# Patient Record
Sex: Female | Born: 1960
Health system: Southern US, Community
[De-identification: ages and names within clinical notes are randomized; demographics above are authoritative.]

## PROBLEM LIST (undated history)

## (undated) DIAGNOSIS — R000 Tachycardia, unspecified: Secondary | ICD-10-CM

## (undated) DIAGNOSIS — K602 Anal fissure, unspecified: Secondary | ICD-10-CM

## (undated) DIAGNOSIS — R87619 Unspecified abnormal cytological findings in specimens from cervix uteri: Secondary | ICD-10-CM

## (undated) HISTORY — DX: Tachycardia, unspecified: R00.0

## (undated) HISTORY — DX: Anal fissure, unspecified: K60.2

## (undated) HISTORY — DX: Unspecified abnormal cytological findings in specimens from cervix uteri: R87.619

---

## 1995-06-07 HISTORY — PX: CAUTERIZE INNER NOSE: SHX279

## 1998-10-19 ENCOUNTER — Other Ambulatory Visit: Admission: RE | Admit: 1998-10-19 | Discharge: 1998-10-19 | Payer: Self-pay | Admitting: Gynecology

## 2002-11-20 ENCOUNTER — Other Ambulatory Visit: Admission: RE | Admit: 2002-11-20 | Discharge: 2002-11-20 | Payer: Self-pay | Admitting: Gynecology

## 2003-05-21 ENCOUNTER — Other Ambulatory Visit: Admission: RE | Admit: 2003-05-21 | Discharge: 2003-05-21 | Payer: Self-pay | Admitting: Gynecology

## 2003-11-21 ENCOUNTER — Other Ambulatory Visit: Admission: RE | Admit: 2003-11-21 | Discharge: 2003-11-21 | Payer: Self-pay | Admitting: Gynecology

## 2004-11-26 ENCOUNTER — Other Ambulatory Visit: Admission: RE | Admit: 2004-11-26 | Discharge: 2004-11-26 | Payer: Self-pay | Admitting: Gynecology

## 2005-11-30 ENCOUNTER — Other Ambulatory Visit: Admission: RE | Admit: 2005-11-30 | Discharge: 2005-11-30 | Payer: Self-pay | Admitting: Gynecology

## 2007-01-24 ENCOUNTER — Other Ambulatory Visit: Admission: RE | Admit: 2007-01-24 | Discharge: 2007-01-24 | Payer: Self-pay | Admitting: Gynecology

## 2007-06-07 DIAGNOSIS — R000 Tachycardia, unspecified: Secondary | ICD-10-CM

## 2007-06-07 HISTORY — DX: Tachycardia, unspecified: R00.0

## 2007-06-14 ENCOUNTER — Emergency Department (HOSPITAL_COMMUNITY): Admission: EM | Admit: 2007-06-14 | Discharge: 2007-06-14 | Payer: Self-pay | Admitting: Emergency Medicine

## 2008-01-25 ENCOUNTER — Other Ambulatory Visit: Admission: RE | Admit: 2008-01-25 | Discharge: 2008-01-25 | Payer: Self-pay | Admitting: Gynecology

## 2009-01-07 IMAGING — CR DG CHEST 2V
2 series · 2 of 2 positions shown · non-contrast
Comparison: None.

CLINICAL DATA: Palpitations

[view not recorded (1 of 2)]
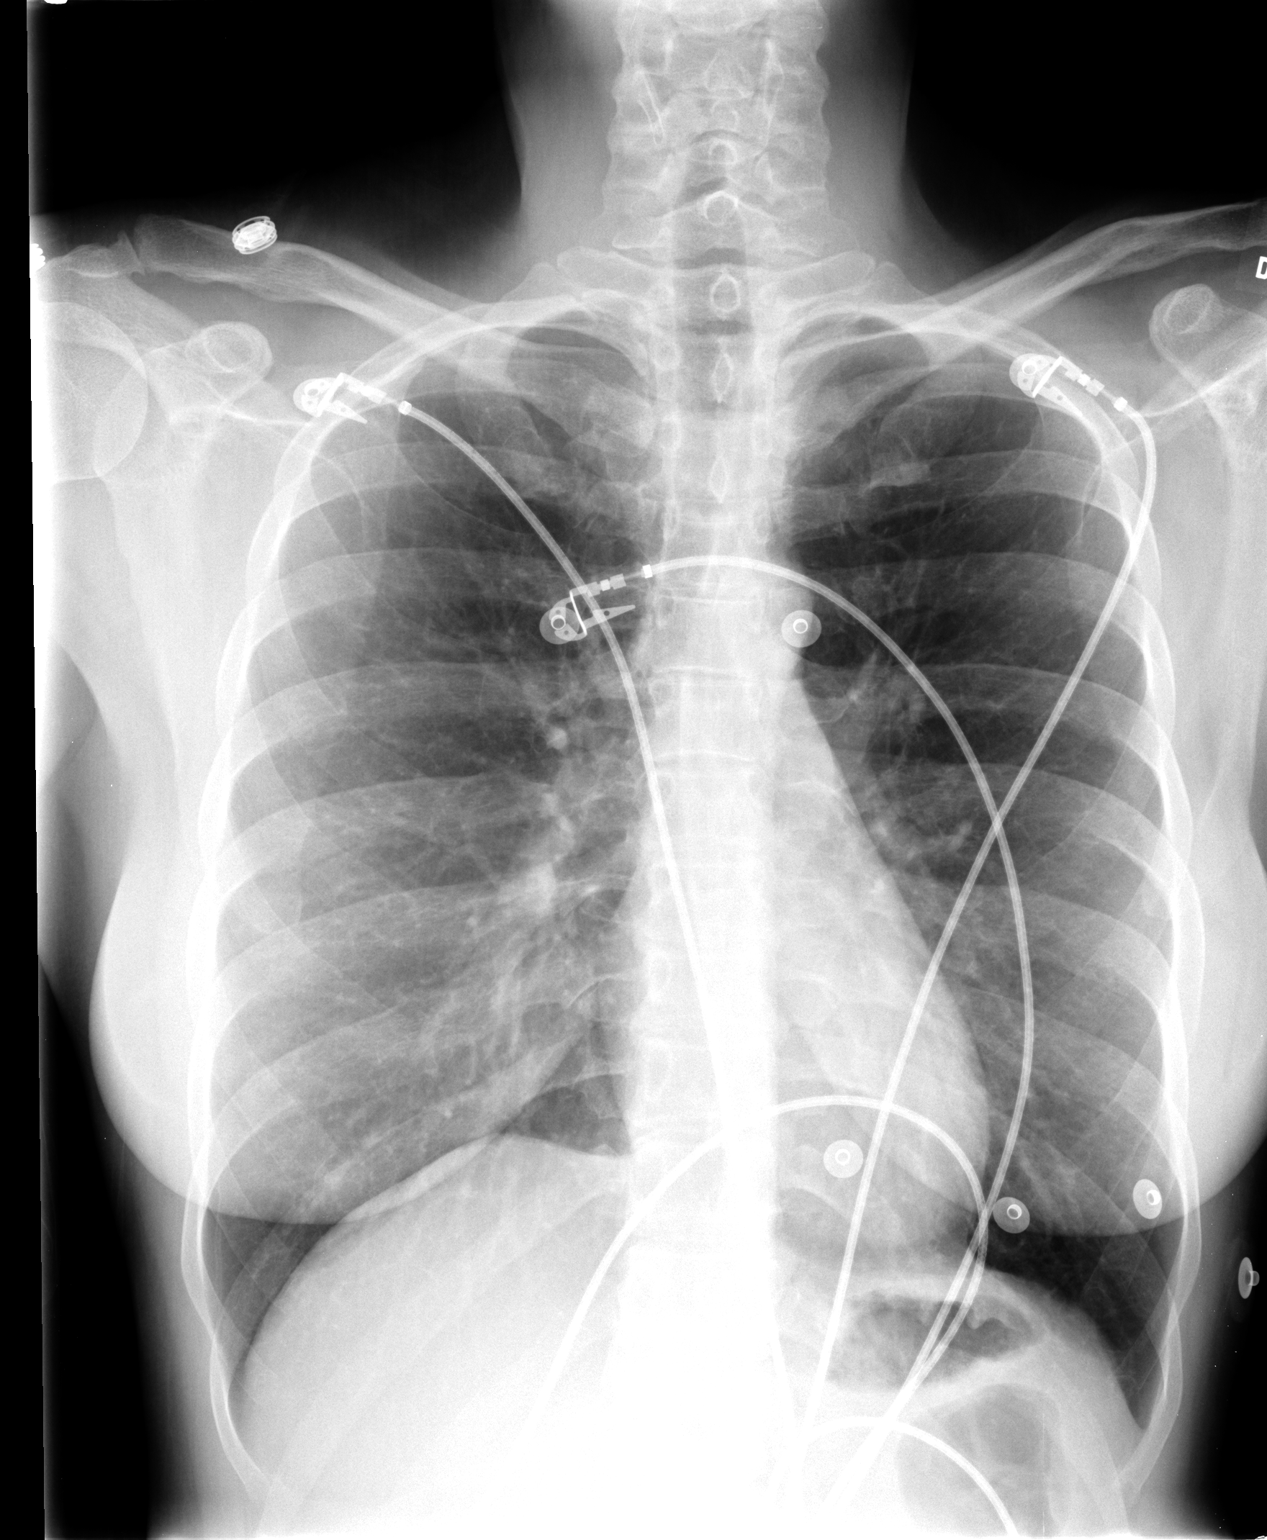

[view not recorded (2 of 2)]
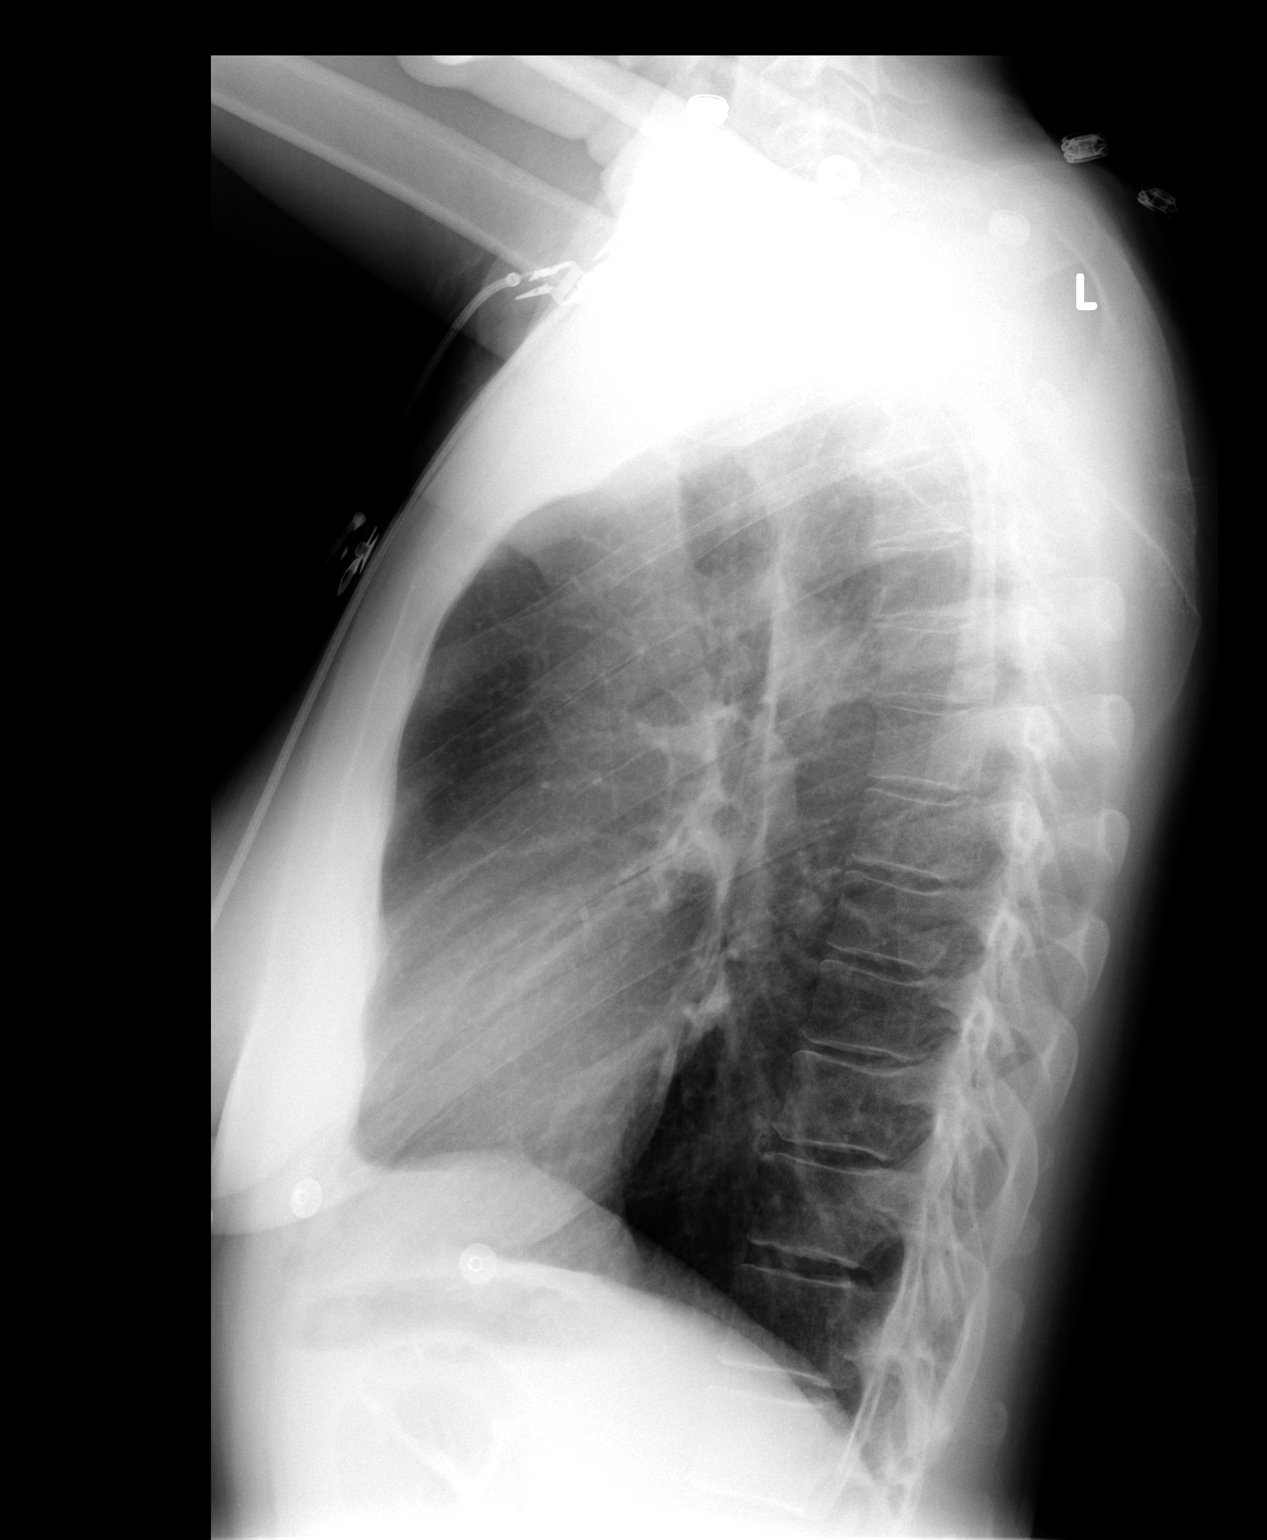

[2 of 2 positions shown; findings below may reference images not displayed]

CHEST - 2 VIEW:

The lungs are clear.  The cardiopericardial silhouette is within normal limits
for size.  Visualized bony structures of the thorax are intact.
IMPRESSION: No acute cardiopulmonary process

## 2009-01-21 ENCOUNTER — Other Ambulatory Visit: Admission: RE | Admit: 2009-01-21 | Discharge: 2009-01-21 | Payer: Self-pay | Admitting: Gynecology

## 2009-01-21 ENCOUNTER — Ambulatory Visit: Payer: Self-pay | Admitting: Women's Health

## 2009-01-21 ENCOUNTER — Encounter: Payer: Self-pay | Admitting: Women's Health

## 2009-06-06 DIAGNOSIS — R87619 Unspecified abnormal cytological findings in specimens from cervix uteri: Secondary | ICD-10-CM

## 2009-06-06 HISTORY — DX: Unspecified abnormal cytological findings in specimens from cervix uteri: R87.619

## 2009-06-06 HISTORY — PX: HYSTEROSCOPY: SHX211

## 2009-06-06 HISTORY — PX: DILATION AND CURETTAGE OF UTERUS: SHX78

## 2009-12-21 ENCOUNTER — Other Ambulatory Visit: Admission: RE | Admit: 2009-12-21 | Discharge: 2009-12-21 | Payer: Self-pay | Admitting: Gynecology

## 2009-12-21 ENCOUNTER — Ambulatory Visit: Payer: Self-pay | Admitting: Women's Health

## 2010-01-05 ENCOUNTER — Ambulatory Visit: Payer: Self-pay | Admitting: Gynecology

## 2010-01-08 ENCOUNTER — Ambulatory Visit: Payer: Self-pay | Admitting: Gynecology

## 2010-01-27 ENCOUNTER — Ambulatory Visit (HOSPITAL_BASED_OUTPATIENT_CLINIC_OR_DEPARTMENT_OTHER): Admission: RE | Admit: 2010-01-27 | Discharge: 2010-01-27 | Payer: Self-pay | Admitting: Gynecology

## 2010-01-27 ENCOUNTER — Ambulatory Visit: Payer: Self-pay | Admitting: Gynecology

## 2010-02-10 ENCOUNTER — Ambulatory Visit: Payer: Self-pay | Admitting: Gynecology

## 2010-08-20 LAB — POCT HEMOGLOBIN-HEMACUE: Hemoglobin: 14.9 g/dL (ref 12.0–15.0)

## 2010-10-27 ENCOUNTER — Other Ambulatory Visit: Payer: Self-pay | Admitting: Women's Health

## 2010-10-27 ENCOUNTER — Encounter (INDEPENDENT_AMBULATORY_CARE_PROVIDER_SITE_OTHER): Payer: BC Managed Care – PPO | Admitting: Women's Health

## 2010-10-27 ENCOUNTER — Other Ambulatory Visit (HOSPITAL_COMMUNITY)
Admission: RE | Admit: 2010-10-27 | Discharge: 2010-10-27 | Disposition: A | Discharge status: Home / Self Care | Payer: BC Managed Care – PPO | Source: Ambulatory Visit | Attending: Gynecology | Admitting: Gynecology

## 2010-10-27 DIAGNOSIS — Z01419 Encounter for gynecological examination (general) (routine) without abnormal findings: Secondary | ICD-10-CM

## 2010-10-27 DIAGNOSIS — Z124 Encounter for screening for malignant neoplasm of cervix: Secondary | ICD-10-CM | POA: Insufficient documentation

## 2010-10-27 DIAGNOSIS — Z1231 Encounter for screening mammogram for malignant neoplasm of breast: Secondary | ICD-10-CM

## 2010-11-25 ENCOUNTER — Ambulatory Visit (HOSPITAL_COMMUNITY)
Admission: RE | Admit: 2010-11-25 | Discharge: 2010-11-25 | Disposition: A | Discharge status: Home / Self Care | Payer: BC Managed Care – PPO | Source: Ambulatory Visit | Attending: Women's Health | Admitting: Women's Health

## 2010-11-25 DIAGNOSIS — Z1231 Encounter for screening mammogram for malignant neoplasm of breast: Secondary | ICD-10-CM | POA: Insufficient documentation

## 2011-02-17 ENCOUNTER — Other Ambulatory Visit: Payer: Self-pay | Admitting: Women's Health

## 2011-02-24 LAB — POCT CARDIAC MARKERS
CKMB, poc: <1 — ABNORMAL LOW
Myoglobin, poc: 77.4
Operator id: 198171
Troponin i, poc: <0.05

## 2011-02-24 LAB — I-STAT 8, (EC8 V) (CONVERTED LAB)
Acid-Base Excess: 2
Chloride: 107
Glucose, Bld: 80
Potassium: 4.2
pCO2, Ven: 38.4 — ABNORMAL LOW
pH, Ven: 7.441 — ABNORMAL HIGH

## 2011-02-24 LAB — POCT I-STAT CREATININE
Creatinine, Ser: 0.9
Operator id: 198171

## 2011-03-01 DIAGNOSIS — F419 Anxiety disorder, unspecified: Secondary | ICD-10-CM | POA: Insufficient documentation

## 2011-05-02 MED ORDER — LEVONORG-ETH ESTRAD TRIPHASIC PO TABS: 1.0000 | ORAL_TABLET | Freq: Every day | ORAL | Status: DC

## 2011-05-02 NOTE — Telephone Encounter (Signed)
Addended by: Venora Maples on: 05/02/2011 01:00 PM   Modules accepted: Orders

## 2011-05-02 NOTE — Telephone Encounter (Signed)
PT. CALLED STATING WE NEED TO SEND IN CORRECT 90 DAY SUPPLY WITH REFILLS. NOTIFIED PT RX FIXED AND RESENT. PT. NOTIFIED RX FIXED.

## 2011-05-24 ENCOUNTER — Other Ambulatory Visit: Payer: Self-pay | Admitting: *Deleted

## 2011-05-24 MED ORDER — ALPRAZOLAM 0.25 MG PO TABS: 0.2500 mg | ORAL_TABLET | Freq: Every evening | ORAL | Status: DC | PRN

## 2011-05-24 NOTE — Telephone Encounter (Signed)
RX CALLED IN

## 2011-06-17 ENCOUNTER — Other Ambulatory Visit: Payer: Self-pay | Admitting: *Deleted

## 2011-06-17 ENCOUNTER — Telehealth: Payer: Self-pay | Admitting: *Deleted

## 2011-06-17 MED ORDER — LEVONORG-ETH ESTRAD TRIPHASIC PO TABS: 1.0000 | ORAL_TABLET | Freq: Every day | ORAL | Status: DC

## 2011-06-17 NOTE — Telephone Encounter (Signed)
Pt called stating that pharmacy gave her wrong rx for birth control, will resent medication for pt.

## 2011-06-20 ENCOUNTER — Telehealth: Payer: Self-pay | Admitting: *Deleted

## 2011-06-20 NOTE — Telephone Encounter (Signed)
Error

## 2011-06-21 ENCOUNTER — Telehealth: Payer: Self-pay | Admitting: *Deleted

## 2011-06-21 NOTE — Telephone Encounter (Signed)
Pt calling wanting to start back on her Trivora  28 birth control tablets. She does not want the generic brand. She was taking generic enpresse 28 and then they gave her generic myzilra tablets.  Pt said [facility] told her since myzilra pills have been filled already that the only way she could get the brand Lennie Hummer is if rx says"fill now pt is not taking myzilra birth contro due to health management". Please advise

## 2011-06-27 ENCOUNTER — Encounter: Payer: Self-pay | Admitting: *Deleted

## 2011-06-27 MED ORDER — LEVONORG-ETH ESTRAD TRIPHASIC PO TABS: 1.0000 | ORAL_TABLET | Freq: Every day | ORAL | Status: DC

## 2011-06-27 NOTE — Progress Notes (Signed)
Patient ID: Traci Jacobs, female   DOB: July 30, 1960, 51 y.o.   MRN: [id number] Pt called wanting to have 3 pack [facility] [place] for brand only trivora 28 day pill. [facility] will not mail rx out until 8 days later.

## 2011-06-27 NOTE — Telephone Encounter (Signed)
Please call in trivora for pt with stipulations of fill now pt is not taking myzilra due to health management.  Since [facility] please call in 3 pks with refills.

## 2011-06-27 NOTE — Telephone Encounter (Signed)
Pt informed with the below note, rx sent to [facility]

## 2011-08-30 ENCOUNTER — Other Ambulatory Visit: Payer: Self-pay | Admitting: Women's Health

## 2011-08-30 DIAGNOSIS — Z1231 Encounter for screening mammogram for malignant neoplasm of breast: Secondary | ICD-10-CM

## 2011-10-25 ENCOUNTER — Ambulatory Visit (HOSPITAL_COMMUNITY)
Admission: RE | Admit: 2011-10-25 | Discharge: 2011-10-25 | Disposition: A | Discharge status: Home / Self Care | Payer: 59 | Source: Ambulatory Visit | Attending: Women's Health | Admitting: Women's Health

## 2011-10-25 DIAGNOSIS — Z1231 Encounter for screening mammogram for malignant neoplasm of breast: Secondary | ICD-10-CM | POA: Insufficient documentation

## 2011-10-27 ENCOUNTER — Ambulatory Visit (INDEPENDENT_AMBULATORY_CARE_PROVIDER_SITE_OTHER): Payer: 59 | Admitting: Women's Health

## 2011-10-27 ENCOUNTER — Encounter: Payer: Self-pay | Admitting: Women's Health

## 2011-10-27 ENCOUNTER — Other Ambulatory Visit (HOSPITAL_COMMUNITY)
Admission: RE | Admit: 2011-10-27 | Discharge: 2011-10-27 | Disposition: A | Discharge status: Home / Self Care | Payer: 59 | Source: Ambulatory Visit | Attending: Obstetrics and Gynecology | Admitting: Obstetrics and Gynecology

## 2011-10-27 VITALS — BP 120/78 | Ht 67.0 in | Wt 156.0 lb

## 2011-10-27 DIAGNOSIS — Z01419 Encounter for gynecological examination (general) (routine) without abnormal findings: Secondary | ICD-10-CM

## 2011-10-27 MED ORDER — LEVONORG-ETH ESTRAD TRIPHASIC PO TABS: 1.0000 | ORAL_TABLET | Freq: Every day | ORAL | Status: DC

## 2011-10-27 MED ORDER — TRIVORA (28) 50-30/75-40/ 125-30 MCG PO TABS: 1.0000 | ORAL_TABLET | Freq: Every day | ORAL | Status: DC

## 2011-10-27 NOTE — Patient Instructions (Signed)

## 2011-10-27 NOTE — Progress Notes (Signed)
Traci Jacobs 03-Apr-1961 [id number]    History:    The patient presents for annual exam.  Monthly 4 days cycle on Trivora without complaint. History of atypical glandular cells on Pap in 2011 with hysteroscopy, D&C with negative/benign  ECC and [facility] 8/11.  Normal Pap 2012, and Paps prior. History of a normal colonoscopy July 2010, history of anal fissure. History of normal mammograms.  Past medical history, past surgical history, family history and social history were all reviewed and documented in the EPIC chart. Works for [employer] from home. Healthy diet, very active lifestyle, yoga and weights.   ROS:  A  ROS was performed and pertinent positives and negatives are included in the history.  Exam:  Filed Vitals:   10/27/11 1201  BP: 120/78    General appearance:  Normal Head/Neck:  Normal, without cervical or supraclavicular adenopathy. Thyroid:  Symmetrical, normal in size, without palpable masses or nodularity. Respiratory  Effort:  Normal  Auscultation:  Clear without wheezing or rhonchi Cardiovascular  Auscultation:  Regular rate, without rubs, murmurs or gallops  Edema/varicosities:  Not grossly evident Abdominal  Soft,nontender, without masses, guarding or rebound.  Liver/spleen:  No organomegaly noted  Hernia:  None appreciated  Skin  Inspection:  Grossly normal  Palpation:  Grossly normal Neurologic/psychiatric  Orientation:  Normal with appropriate conversation.  Mood/affect:  Normal  Genitourinary    Breasts: Examined lying and sitting.     Right: Without masses, retractions, discharge or axillary adenopathy.     Left: Without masses, retractions, discharge or axillary adenopathy.   Inguinal/mons:  Normal without inguinal adenopathy  External genitalia:  Normal  BUS/Urethra/Skene's glands:  Normal  Bladder:  Normal  Vagina:  Normal  Cervix:  Normal  Uterus:   normal in size, shape and contour.  Midline and mobile  Adnexa/parametria:      Rt: Without masses or tenderness.   Lt: Without masses or tenderness.  Anus and perineum: Normal  Digital rectal exam: Refused, history of fissures.  Assessment/Plan:  51 y.o. M WF G0  for annual exam with no complaints.  Normal GYN exam on Trivora History of atypical glandular cells on Pap with a benign hysteroscopic D&C with benign pathology August 2011/normal Pap 2012  Plan: Trivora prescription, branded name, proper use, slight risk for blood clots and strokes. Menopausal symptoms reviewed, denies, states sisters were mid 12s. SBE's, continue annual mammogram, calcium rich diet, MVI daily, vitamin D 1000 daily and continue healthy lifestyle encouraged. No labs, had normal labs at primary care. Pap only.    Harrington Challenger [facility], 12:50 PM 10/27/2011

## 2012-08-16 ENCOUNTER — Other Ambulatory Visit: Payer: Self-pay | Admitting: Women's Health

## 2012-08-16 DIAGNOSIS — Z1231 Encounter for screening mammogram for malignant neoplasm of breast: Secondary | ICD-10-CM

## 2012-10-16 ENCOUNTER — Other Ambulatory Visit (HOSPITAL_BASED_OUTPATIENT_CLINIC_OR_DEPARTMENT_OTHER): Payer: Self-pay | Admitting: Gynecology

## 2012-10-17 NOTE — Telephone Encounter (Signed)
Rx called in KW 

## 2012-10-22 ENCOUNTER — Ambulatory Visit (HOSPITAL_COMMUNITY)
Admission: RE | Admit: 2012-10-22 | Discharge: 2012-10-22 | Disposition: A | Discharge status: Home / Self Care | Payer: 59 | Source: Ambulatory Visit | Attending: Women's Health | Admitting: Women's Health

## 2012-10-22 DIAGNOSIS — Z1231 Encounter for screening mammogram for malignant neoplasm of breast: Secondary | ICD-10-CM | POA: Insufficient documentation

## 2012-10-24 ENCOUNTER — Other Ambulatory Visit: Payer: Self-pay | Admitting: Women's Health

## 2012-10-24 ENCOUNTER — Other Ambulatory Visit: Payer: Self-pay | Admitting: *Deleted

## 2012-10-24 DIAGNOSIS — N6489 Other specified disorders of breast: Secondary | ICD-10-CM

## 2012-10-24 DIAGNOSIS — R928 Other abnormal and inconclusive findings on diagnostic imaging of breast: Secondary | ICD-10-CM

## 2012-11-06 ENCOUNTER — Other Ambulatory Visit: Payer: 59

## 2012-11-06 ENCOUNTER — Ambulatory Visit
Admission: RE | Admit: 2012-11-06 | Discharge: 2012-11-06 | Disposition: A | Discharge status: Home / Self Care | Payer: 59 | Source: Ambulatory Visit | Attending: Women's Health | Admitting: Women's Health

## 2012-11-06 DIAGNOSIS — N6489 Other specified disorders of breast: Secondary | ICD-10-CM

## 2012-11-21 ENCOUNTER — Encounter: Payer: Self-pay | Admitting: Women's Health

## 2012-11-23 ENCOUNTER — Ambulatory Visit (INDEPENDENT_AMBULATORY_CARE_PROVIDER_SITE_OTHER): Payer: 59 | Admitting: Women's Health

## 2012-11-23 ENCOUNTER — Encounter: Payer: Self-pay | Admitting: Women's Health

## 2012-11-23 ENCOUNTER — Other Ambulatory Visit (HOSPITAL_COMMUNITY)
Admission: RE | Admit: 2012-11-23 | Discharge: 2012-11-23 | Disposition: A | Discharge status: Home / Self Care | Payer: 59 | Source: Ambulatory Visit | Attending: Gynecology | Admitting: Gynecology

## 2012-11-23 VITALS — BP 128/76 | Ht 67.0 in | Wt 156.0 lb

## 2012-11-23 DIAGNOSIS — IMO0001 Reserved for inherently not codable concepts without codable children: Secondary | ICD-10-CM

## 2012-11-23 DIAGNOSIS — Z309 Encounter for contraceptive management, unspecified: Secondary | ICD-10-CM

## 2012-11-23 DIAGNOSIS — Z01419 Encounter for gynecological examination (general) (routine) without abnormal findings: Secondary | ICD-10-CM | POA: Insufficient documentation

## 2012-11-23 MED ORDER — TRIVORA (28) 50-30/75-40/ 125-30 MCG PO TABS: 1.0000 | ORAL_TABLET | Freq: Every day | ORAL | Status: DC

## 2012-11-23 NOTE — Progress Notes (Signed)
Traci Jacobs April 26, 1961 [id number]    History:    The patient presents for annual exam.  Light monthly cycle on Trivora. Occasional hot flushes. Reports sisters mid 46s when menopausal. History of atypical  endometrial cells on Pap in 2011 with a negative biopsy and negative hysteroscopy. History of tachycardia with negative workup. Negative colonoscopy 2010. Negative diagnostic mammogram 10/2012.   Past medical history, past surgical history, family history and social history were all reviewed and documented in the EPIC chart. Works from home for [employer]Marland Kitchen Healthy lifestyle of exercise and diet. Going to [place] for 2 weeks in the fall.   ROS:  A  ROS was performed and pertinent positives and negatives are included in the history.  Exam:  Filed Vitals:   11/23/12 1005  BP: 128/76    General appearance:  Normal Head/Neck:  Normal, without cervical or supraclavicular adenopathy. Thyroid:  Symmetrical, normal in size, without palpable masses or nodularity. Respiratory  Effort:  Normal  Auscultation:  Clear without wheezing or rhonchi Cardiovascular  Auscultation:  Regular rate, without rubs, murmurs or gallops  Edema/varicosities:  Not grossly evident Abdominal  Soft,nontender, without masses, guarding or rebound.  Liver/spleen:  No organomegaly noted  Hernia:  None appreciated  Skin  Inspection:  Grossly normal  Palpation:  Grossly normal Neurologic/psychiatric  Orientation:  Normal with appropriate conversation.  Mood/affect:  Normal  Genitourinary    Breasts: refused exam, states just had a diagnostic mammogram.     Left: Without masses, retractions, discharge or axillary adenopathy.   Inguinal/mons:  Normal without inguinal adenopathy  External genitalia:  Normal  BUS/Urethra/Skene's glands:  Normal  Bladder:  Normal  Vagina:  Normal  Cervix:  Normal  Uterus:   normal in size, shape and contour.  Midline and mobile  Adnexa/parametria:     Rt: Without  masses or tenderness.   Lt: Without masses or tenderness.  Anus and perineum: Normal  Digital rectal exam: Normal sphincter tone without palpated masses or tenderness  Assessment/Plan:  [name]  for annual exam.    Normal perimenopausal exam on Trivora  Plan: Instructed to return to office for [facility] during placebo week, Trivora prescription, proper use, slight risk for blood clots and strokes reviewed. SBE's, continue annual mammogram, 3-D tomography reviewed and encouraged. Had labs at primary care that she reports as normal. Pap normal but lacked endocervical cells 2013, Pap taken. Home Hemoccult card given with instructions.   Harrington Challenger [facility], 1:23 PM 11/23/2012

## 2012-11-23 NOTE — Patient Instructions (Addendum)

## 2012-11-26 ENCOUNTER — Encounter: Payer: Self-pay | Admitting: Gynecology

## 2012-12-25 ENCOUNTER — Other Ambulatory Visit: Payer: Self-pay | Admitting: Women's Health

## 2013-09-17 ENCOUNTER — Other Ambulatory Visit: Payer: Self-pay

## 2013-09-17 DIAGNOSIS — Z1231 Encounter for screening mammogram for malignant neoplasm of breast: Secondary | ICD-10-CM

## 2013-10-13 ENCOUNTER — Other Ambulatory Visit: Payer: Self-pay | Admitting: Women's Health

## 2013-10-22 ENCOUNTER — Ambulatory Visit
Admission: RE | Admit: 2013-10-22 | Discharge: 2013-10-22 | Disposition: A | Discharge status: Home / Self Care | Payer: 59 | Source: Ambulatory Visit

## 2013-10-22 ENCOUNTER — Encounter (INDEPENDENT_AMBULATORY_CARE_PROVIDER_SITE_OTHER): Payer: Self-pay

## 2013-10-22 DIAGNOSIS — Z1231 Encounter for screening mammogram for malignant neoplasm of breast: Secondary | ICD-10-CM

## 2013-11-22 ENCOUNTER — Encounter: Payer: 59 | Admitting: Women's Health

## 2013-12-24 ENCOUNTER — Encounter: Payer: 59 | Admitting: Gynecology

## 2014-01-05 ENCOUNTER — Other Ambulatory Visit: Payer: Self-pay | Admitting: Women's Health

## 2014-01-10 ENCOUNTER — Telehealth: Payer: Self-pay

## 2014-01-10 ENCOUNTER — Other Ambulatory Visit: Payer: Self-pay | Admitting: Women's Health

## 2014-01-10 MED ORDER — LEVONORG-ETH ESTRAD TRIPHASIC PO TABS: 1.0000 | ORAL_TABLET | Freq: Every day | ORAL | Status: DC

## 2014-01-10 NOTE — Telephone Encounter (Signed)
Refill for 1 month.

## 2014-01-10 NOTE — Telephone Encounter (Signed)
Rx sent for ONE pack to pharmacy.

## 2014-01-10 NOTE — Telephone Encounter (Signed)
On 8/2 pharmacy sent refill request for patient's Trivora 3 packs.  It was denied because patient overdue for CE and has NG appt with Dr. Douglass Riversracy Lathrop on 01/20/14.  The pharmacy was calling today to question if the could refill it.

## 2014-01-20 ENCOUNTER — Encounter: Payer: 59 | Admitting: Gynecology

## 2014-01-20 ENCOUNTER — Encounter: Payer: Self-pay | Admitting: Gynecology

## 2014-01-20 ENCOUNTER — Ambulatory Visit (INDEPENDENT_AMBULATORY_CARE_PROVIDER_SITE_OTHER): Payer: 59 | Admitting: Gynecology

## 2014-01-20 VITALS — BP 114/84 | HR 76 | Resp 16 | Ht 66.0 in | Wt 164.0 lb

## 2014-01-20 DIAGNOSIS — N898 Other specified noninflammatory disorders of vagina: Secondary | ICD-10-CM

## 2014-01-20 DIAGNOSIS — N959 Unspecified menopausal and perimenopausal disorder: Secondary | ICD-10-CM

## 2014-01-20 DIAGNOSIS — Z3041 Encounter for surveillance of contraceptive pills: Secondary | ICD-10-CM

## 2014-01-20 DIAGNOSIS — Z01419 Encounter for gynecological examination (general) (routine) without abnormal findings: Secondary | ICD-10-CM

## 2014-01-20 DIAGNOSIS — Z0000 Encounter for general adult medical examination without abnormal findings: Secondary | ICD-10-CM

## 2014-01-20 LAB — POCT URINALYSIS DIPSTICK
LEUKOCYTES UA: NEGATIVE
PH UA: 5
UROBILINOGEN UA: NEGATIVE

## 2014-01-20 LAB — POCT WET PREP (WET MOUNT): Clue Cells Wet Prep Whiff POC: NEGATIVE

## 2014-01-20 MED ORDER — LEVONORGESTREL-ETHINYL ESTRAD 0.1-20 MG-MCG PO TABS
1.0000 | ORAL_TABLET | Freq: Every day | ORAL | Status: DC
Start: 2014-01-20 — End: 2014-01-20

## 2014-01-20 MED ORDER — LEVONORGESTREL-ETHINYL ESTRAD 0.1-20 MG-MCG PO TABS: 1.0000 | ORAL_TABLET | Freq: Every day | ORAL | Status: DC

## 2014-01-20 NOTE — Progress Notes (Signed)
53 y.o. Married Caucasian female   G0P0 here for annual exam. Pt reports menses are regular on ocp.  Pt has been on ocp for 30y, no children by choice.  Pt reports cycles are very light and she has some night sweats.    Patient's last menstrual period was 01/06/2014.          Sexually active: Yes.    The current method of family planning is OCP (estrogen/progesterone).    Exercising: Yes.    run, jump, weights, yoga 7x/wk Last pap: 11/23/12 NEG Abnormal PAP: Yes, 2011  Mammogram: 10/24/13 Bi-Rads 1 BSE:  Yes when she thinks about it Colonoscopy:  2010- Normal f/u in q 5 years  DEXA:  2009/2010-foot scan Alcohol: glass of wine 5x/wk Tobacco: no  Labs: Kip Corrington, MD . Urine: Negative   Health Maintenance  Topic Date Due  . Tetanus/tdap  08/17/1980  . Colonoscopy  08/18/2011  . Influenza Vaccine  01/04/2014  . Mammogram  10/23/2015  . Pap Smear  11/24/2015    Family History  Problem Relation Age of Onset  . Hypertension Mother   . Heart disease Maternal Aunt   . Breast cancer Paternal Aunt   . Heart disease Maternal Grandmother   . Breast cancer Cousin   . Diabetes Brother     There are no active problems to display for this patient.   Past Medical History  Diagnosis Date  . Tachycardia     NEG WORK UP    Past Surgical History  Procedure Laterality Date  . Hysteroscopy  2011  . Dilation and curettage of uterus  2011    ATYPICAL ENDOMETRIAL CELLS ON PAP BENIGN PATH  . Cauterize inner nose  1997    Allergies: Guaifenesin er  Current Outpatient Prescriptions  Medication Sig Dispense Refill  . levonorgestrel-ethinyl estradiol (ENPRESSE,TRIVORA) tablet Take 1 tablet by mouth daily.  1 Package  0  . Multiple Vitamin (MULTIVITAMIN) tablet Take 1 tablet by mouth daily.        . TRIVORA, 28, tablet TAKE 1 TABLET DAILY  3 Package  0  . ALPRAZolam (XANAX) 0.25 MG tablet TAKE 1 TABLET BY MOUTH EVERY 8 HOURS AS NEEDED  30 tablet  0   No current facility-administered  medications for this visit.    ROS: Pertinent items are noted in HPI.  Exam:    BP 114/84  Pulse 76  Resp 16  Ht 5\' 6"  (1.676 m)  Wt 164 lb (74.39 kg)  BMI 26.48 kg/m2  LMP 01/06/2014 Weight change: @WEIGHTCHANGE @ Last 3 height recordings:  Ht Readings from Last 3 Encounters:  01/20/14 5\' 6"  (1.676 m)  11/23/12 5\' 7"  (1.702 m)  10/27/11 5\' 7"  (1.702 m)   General appearance: alert, cooperative and appears stated age Head: Normocephalic, without obvious abnormality, atraumatic Neck: no adenopathy, no carotid bruit, no JVD, supple, symmetrical, trachea midline and thyroid not enlarged, symmetric, no tenderness/mass/nodules Lungs: clear to auscultation bilaterally Breasts: normal appearance, no masses or tenderness Heart: regular rate and rhythm, S1, S2 normal, no murmur, click, rub or gallop Abdomen: soft, non-tender; bowel sounds normal; no masses,  no organomegaly Extremities: extremities normal, atraumatic, no cyanosis or edema Skin: Skin color, texture, turgor normal. No rashes or lesions Lymph nodes: Cervical, supraclavicular, and axillary nodes normal. no inguinal nodes palpated Neurologic: Grossly normal   Pelvic: External genitalia:  Erythema with mild excoriation lateral to labia              Urethra: normal appearing urethra  with no masses, tenderness or lesions              Bartholins and Skenes: Bartholin's, Urethra, Skene's normal                 Vagina: normal appearing vagina with normal color and discharge, no lesions, vaginal discharge - white, odorless and thin              Cervix: normal appearance              Pap taken: No.        Bimanual Exam:  Uterus:  uterus is normal size, shape, consistency and nontender                                      Adnexa:    no masses                                      Rectovaginal: pt refused                                      Anus:  defer exam, refused     1. Routine gynecological examination counseled on breast  self exam, mammography screening, menopause, adequate intake of calcium and vitamin D, diet and exercise Atypical endometrial cells on pap while on ocp-s/p D/C hysteroscopy, EMB and 3 normal repeat paps return annually or prn Discussed PAP guideline changes, importance of weight bearing exercises, calcium, vit D   2. Laboratory examination ordered as part of a routine general medical examination  - POCT Urinalysis Dipstick  3. Encounter for surveillance of contraceptive pills Will slowly decrease estrogen from 30/40 to 20mcg and then check FSH 3948m off, pt agreeable. - levonorgestrel-ethinyl estradiol (AVIANE,ALESSE,LESSINA) 0.1-20 MG-MCG tablet; Take 1 tablet by mouth daily.  Dispense: 1 Package; Refill: 12  4. Vaginal discharge  - POCT Wet Prep [facility]([place])  5.perimenopause Discussed at length menopause and that she is falsely normal due to her ocp.  Recommend tapering her estrogen dose down and than stopping ocp and checking FSH levels.  Pt aware that she may bleed intermittently after stopping ocp.  Questions regarding menopause were addressed  Additional 8526m spent counseling re menopause  An After Visit Summary was printed and given to the patient.

## 2014-06-03 ENCOUNTER — Telehealth: Payer: Self-pay | Admitting: Gynecology

## 2014-06-03 NOTE — Telephone Encounter (Signed)
Spoke with patient. Patient states that when she saw Dr.Lathop in August she was switched to a lower dose birth control for three months and then was supposed to stop for one month [facility]. Patient has been off birth control for over a month and had FSH checked with her PCP last week. FSH level was 14.9. "I just don't understand. I have been having night sweats and my periods are pretty much non existent. This has been going on for 1 year. It doesn't make any sense. I just don't know where to go from here. I really don't want to be on birth control any more. It was making my breasts hard and my ankles swell. I have been fine off it with no bleeding. I do not want to get pregnant though." Advised patient will need to speak with covering provider and return call with further recommendations. Patient is agreeable. Will contact PCP and have labs faxed to our office for review.  Routing to Dr.Miller for review as patient was a Dr.Lathrop patient.

## 2014-06-03 NOTE — Telephone Encounter (Signed)
Patient has some questions about her recent blood test results.

## 2014-06-03 NOTE — Telephone Encounter (Signed)
So, FSH is just a test at that moment.  It does fluctuate as a woman is going into menopause.  So, you can test one week and see a lower value and then test again and see a higher value.  What an FSH of 14 shows is that ovulation may still be occuring, even infrequently.  So, needs to use something for contraception.  OCP.  Condoms.  Just something.  Does that help?

## 2014-06-04 MED ORDER — NORETHINDRONE 0.35 MG PO TABS: 1.0000 | ORAL_TABLET | Freq: Every day | ORAL | Status: DC

## 2014-06-04 NOTE — Telephone Encounter (Signed)
Spoke with patient. Advised patient of message as seen below from Dr.Miller. Patient is agreeable. Prescription for Micronor #[id number] sent to [facility]. Patient will call to schedule three month recheck with Dr.Miller when she has her work schedule available.  Routing to provider for final review. Patient agreeable to disposition. Will close encounter

## 2014-06-04 NOTE — Telephone Encounter (Signed)
Spoke with patient. Advised patient of message as seen below from Dr.Miller. "I was really hoping you weren't going to say that. I really don't want to be on birth control because over the last year my ankles have been swelling and my breasts have been rock hard. Now that I stopped I do not have these problems. I really can't tell my husband after 27 years that we need to use a condom." Advised patient with FSH at 14.9 needs something for contraception as she is still ovulating. "I guess I will go back on the pill because I like to make sure I take it every day at the same time. Is there something between the Trivora and the Aviane that I could take?" Advised patient would check with Dr.Miller regarding recommendations and return call. Patient is agreeable.

## 2014-06-04 NOTE — Telephone Encounter (Signed)
Yes.  Let's try micronor.  It has no estrogen in it and will hopefully not have the same symptoms.  Maybe should recheck 3 months with me?

## 2014-08-11 ENCOUNTER — Other Ambulatory Visit: Payer: Self-pay

## 2014-08-11 DIAGNOSIS — Z1231 Encounter for screening mammogram for malignant neoplasm of breast: Secondary | ICD-10-CM

## 2014-08-14 ENCOUNTER — Ambulatory Visit (INDEPENDENT_AMBULATORY_CARE_PROVIDER_SITE_OTHER): Payer: 59 | Admitting: Nurse Practitioner

## 2014-08-14 ENCOUNTER — Encounter: Payer: Self-pay | Admitting: Nurse Practitioner

## 2014-08-14 VITALS — BP 123/80 | HR 76 | Temp 100.0°F | Ht 67.0 in | Wt 169.0 lb

## 2014-08-14 DIAGNOSIS — M778 Other enthesopathies, not elsewhere classified: Secondary | ICD-10-CM

## 2014-08-14 DIAGNOSIS — M659 Synovitis and tenosynovitis, unspecified: Secondary | ICD-10-CM

## 2014-08-14 NOTE — Patient Instructions (Signed)
Alternate heat & ice 3 times daily: 10 minutes each.  Use aspercream 3 times daily followed by heat. Get tendon band & apply 1.5 to 2 inches below sit of most pain. This rests tendon. Avoid bent elbow position as much as you can. Limit mouse use if able.  Please let me know if pain is not improving. Inflammation takes 1 to 3 months to resolve. Consider scheduling appointment for physical.    Tendinitis Tendinitis is swelling and inflammation of the tendons. Tendons are band-like tissues that connect muscle to bone. Tendinitis commonly occurs in the:   Shoulders (rotator cuff).  Heels (Achilles tendon).  Elbows (triceps tendon). CAUSES Tendinitis is usually caused by overusing the tendon, muscles, and joints involved. When the tissue surrounding a tendon (synovium) becomes inflamed, it is called tenosynovitis. Tendinitis commonly develops in people whose jobs require repetitive motions. SYMPTOMS  Pain.  Tenderness.  Mild swelling. DIAGNOSIS Tendinitis is usually diagnosed by physical exam. Your health care provider may also order X-rays or other imaging tests. TREATMENT Your health care provider may recommend certain medicines or exercises for your treatment. HOME CARE INSTRUCTIONS   Use a sling or splint for as long as directed by your health care provider until the pain decreases.  Put ice on the injured area.  Put ice in a plastic bag.  Place a towel between your skin and the bag.  Leave the ice on for 15-20 minutes, 3-4 times a day, or as directed by your health care provider.  Avoid using the limb while the tendon is painful. Perform gentle range of motion exercises only as directed by your health care provider. Stop exercises if pain or discomfort increase, unless directed otherwise by your health care provider.  Only take over-the-counter or prescription medicines for pain, discomfort, or fever as directed by your health care provider. SEEK MEDICAL CARE IF:   Your  pain and swelling increase.  You develop new, unexplained symptoms, especially increased numbness in the hands. MAKE SURE YOU:   Understand these instructions.  Will watch your condition.  Will get help right away if you are not doing well or get worse. Document Released: 05/20/2000 Document Revised: 10/07/2013 Document Reviewed: 08/09/2010 [facility] Patient Information 2015 [place], [place]. This information is not intended to replace advice given to you by your health care provider. Make sure you discuss any questions you have with your health care provider.

## 2014-08-14 NOTE — Progress Notes (Signed)
Pre visit review using our clinic review tool, if applicable. No additional management support is needed unless otherwise documented below in the visit note. 

## 2014-08-15 ENCOUNTER — Telehealth: Payer: Self-pay | Admitting: Nurse Practitioner

## 2014-08-15 ENCOUNTER — Encounter: Payer: Self-pay | Admitting: Nurse Practitioner

## 2014-08-15 NOTE — Telephone Encounter (Signed)
emmi mailed  °

## 2014-08-15 NOTE — Progress Notes (Signed)
Subjective:     Traci Jacobs is a 54 y.o. female and is here to establish care. The patient reports problems - R elbow pain. Thinks she may have "hit" elbow on something about 2 weeks ago-not sure. Pain is worse when picking up purse or flexing elbow. She uses mouse all day, no other repetitive movements. She has been icing elbow without relief.  History   Social History  . Marital Status: Married    Spouse Name: N/A  . Number of Children: 0  . Years of Education: N/A   Occupational History  .      business Technical sales engineerarchitect   Social History Main Topics  . Smoking status: Current Some Day Smoker -- 0 years    Types: Cigars  . Smokeless tobacco: Never Used  . Alcohol Use: 3.6 oz/week    6 Glasses of wine per week  . Drug Use: No  . Sexual Activity:    Partners: Male   Other Topics Concern  . Not on file   Social History Narrative   Ms Raz lives with her husband. She works Teacher, English as a foreign languageT.   Health Maintenance  Topic Date Due  . HIV Screening  08/17/1976  . COLONOSCOPY  08/18/2011  . INFLUENZA VACCINE  08/15/2015 (Originally 01/04/2014)  . MAMMOGRAM  10/23/2015  . PAP SMEAR  11/24/2015  . TETANUS/TDAP  08/14/2020    The following portions of the patient's history were reviewed and updated as appropriate: allergies, current medications, past family history, past medical history, past social history, past surgical history and problem list.  Review of Systems Musculoskeletal:negative for muscle weakness and stiff joints Neurological: negative for coordination problems, paresthesia, tremors and weakness   Objective:    BP 123/80 mmHg  Pulse 76  Temp(Src) 100 F (37.8 C) (Temporal)  Ht 5\' 7"  (1.702 m)  Wt 169 lb (76.658 kg)  BMI 26.46 kg/m2  SpO2 99%  LMP 01/06/2014 General appearance: alert, cooperative, appears stated age and no distress Head: Normocephalic, without obvious abnormality, atraumatic Eyes: negative findings: lids and lashes normal and conjunctivae and  sclerae normal Extremities: tender over medial brachioradialis. Not tender over lat or medi epicondyles. FROM in elbow. Strength nml in grip.  Pulses: 2+ and symmetric Skin: no erythema or rash over site of pain Lymph nodes: no epitrochlear LAD Neurologic: Grossly normal    Assessment:Plan  1. Tendonitis of elbow, right aspercream Alternate ice & heat Elbow strap Declined ref to sports med. F/u PRN

## 2014-10-17 ENCOUNTER — Ambulatory Visit
Admission: RE | Admit: 2014-10-17 | Discharge: 2014-10-17 | Disposition: A | Discharge status: Home / Self Care | Payer: 59 | Source: Ambulatory Visit

## 2014-10-17 DIAGNOSIS — Z1231 Encounter for screening mammogram for malignant neoplasm of breast: Secondary | ICD-10-CM

## 2014-10-23 ENCOUNTER — Ambulatory Visit (INDEPENDENT_AMBULATORY_CARE_PROVIDER_SITE_OTHER): Payer: 59 | Admitting: Nurse Practitioner

## 2014-10-23 ENCOUNTER — Encounter: Payer: Self-pay | Admitting: Nurse Practitioner

## 2014-10-23 VITALS — BP 120/66 | HR 60 | Ht 67.0 in | Wt 166.0 lb

## 2014-10-23 DIAGNOSIS — Z0000 Encounter for general adult medical examination without abnormal findings: Secondary | ICD-10-CM | POA: Diagnosis not present

## 2014-10-23 DIAGNOSIS — Z01419 Encounter for gynecological examination (general) (routine) without abnormal findings: Secondary | ICD-10-CM | POA: Diagnosis not present

## 2014-10-23 LAB — POCT URINALYSIS DIPSTICK
Bilirubin, UA: NEGATIVE
Blood, UA: NEGATIVE
Glucose, UA: NEGATIVE
Ketones, UA: NEGATIVE
Leukocytes, UA: NEGATIVE
Nitrite, UA: NEGATIVE
PROTEIN UA: NEGATIVE
UROBILINOGEN UA: NEGATIVE
pH, UA: 5

## 2014-10-23 NOTE — Progress Notes (Signed)
Patient ID: Traci Jacobs, female   DOB: 13-Jan-1961, 54 y.o.   MRN: [id number] 54 y.o. G0P0 Married  Caucasian Fe here for annual exam.  Since off OCP has rare faint spotting with wiping for less than a day. last FSH done at PCP 05/2014 was 14.9.   Not SA since stopping OCP - husband has HTN and having some problems.  [facility]  She does feel better without OCP in that her breast are not as tender and no edema of ankles.  Patient's last menstrual period was 01/06/2014.          Sexually active: Yes.  Pt has not had SA since stopping OCP. The current method of family planning is none.   Exercising: Yes.    running, weight training and yoga everyday Smoker:  no  Health Maintenance: Pap:  11/23/12, negative  MMG:  10/17/14, 3D, Bi-Rads 1:  Negative Colonoscopy:  2010 TDaP:  UTD Labs:  PCP  Urine:  negative   reports that she has quit smoking. Her smoking use included Cigars. She has never used smokeless tobacco. She reports that she drinks about 3.6 oz of alcohol per week. She reports that she does not use illicit drugs.  Past Medical History  Diagnosis Date  . Tachycardia 06/2007    NEG WORK UP  . Anal fissure     Past Surgical History  Procedure Laterality Date  . Dilation and curettage of uterus  2011    ATYPICAL ENDOMETRIAL CELLS ON PAP BENIGN PATH  . Cauterize inner nose  1997  . Hysteroscopy  2011    negative    Current Outpatient Prescriptions  Medication Sig Dispense Refill  . Multiple Vitamin (MULTIVITAMIN) tablet Take 1 tablet by mouth daily.       No current facility-administered medications for this visit.    Family History  Problem Relation Age of Onset  . Hypertension Mother   . Diverticulitis Mother   . Heart disease Maternal Aunt   . Breast cancer Paternal Aunt   . Heart disease Maternal Grandmother   . Breast cancer Cousin   . Diabetes Brother   . Obesity Brother   . Depression Sister   . Obesity Sister   . Stomach cancer  Maternal Grandfather     ROS:  Pertinent items are noted in HPI.  Otherwise, a comprehensive ROS was negative.  Exam:   BP 120/66 mmHg  Pulse 60  Ht 5\' 7"  (1.702 m)  Wt 166 lb (75.297 kg)  BMI 25.99 kg/m2  LMP 01/06/2014 Height: 5\' 7"  (170.2 cm) Ht Readings from Last 3 Encounters:  10/23/14 5\' 7"  (1.702 m)  08/14/14 5\' 7"  (1.702 m)  01/20/14 5\' 6"  (1.676 m)    General appearance: alert, cooperative and appears stated age Head: Normocephalic, without obvious abnormality, atraumatic Neck: no adenopathy, supple, symmetrical, trachea midline and thyroid normal to inspection and palpation Lungs: clear to auscultation bilaterally Breasts: normal appearance, no masses or tenderness Heart: regular rate and rhythm Abdomen: soft, non-tender; no masses,  no organomegaly Extremities: extremities normal, atraumatic, no cyanosis or edema Skin: Skin color, texture, turgor normal. No rashes or lesions Lymph nodes: Cervical, supraclavicular, and axillary nodes normal. No abnormal inguinal nodes palpated Neurologic: Grossly normal   Pelvic: External genitalia:  no lesions              Urethra:  normal appearing urethra with no masses, tenderness or lesions  Bartholin's and Skene's: normal                 Vagina: normal appearing vagina with normal color and discharge, no lesions              Cervix: anteverted              Pap taken: Yes.   Bimanual Exam:  Uterus:  normal size, contour, position, consistency, mobility, non-tender              Adnexa: no mass, fullness, tenderness               Rectovaginal: declines               Anus:  Declines with history of anal fissure  Chaperone present:  yes  A:  Well Woman with normal exam  Perimenopausal with tolerable symptoms  History of anal fissure  Remote history of atypical endo cells on pap with negative work up 2011  P:   Reviewed health and wellness pertinent to exam  Pap smear as above  Mammogram is due 10/2015  She is  aware of any vaginal bleeding that is prolonged or heavy to call back  Counseled on breast self exam, mammography screening, adequate intake of calcium and vitamin D, diet and exercise, Kegel's exercises return annually or prn  An After Visit Summary was printed and given to the patient.

## 2014-10-23 NOTE — Patient Instructions (Signed)

## 2014-10-26 NOTE — Progress Notes (Signed)
Encounter reviewed by Dr. Vic Esco Silva.  

## 2014-10-28 ENCOUNTER — Ambulatory Visit: Payer: Self-pay | Admitting: Nurse Practitioner

## 2014-10-28 LAB — IPS PAP TEST WITH HPV

## 2015-03-18 ENCOUNTER — Encounter: Payer: Self-pay | Admitting: Nurse Practitioner

## 2015-03-18 ENCOUNTER — Ambulatory Visit (INDEPENDENT_AMBULATORY_CARE_PROVIDER_SITE_OTHER): Payer: 59 | Admitting: Nurse Practitioner

## 2015-03-18 ENCOUNTER — Telehealth: Payer: Self-pay | Admitting: Nurse Practitioner

## 2015-03-18 VITALS — BP 100/66 | HR 60 | Ht 67.0 in | Wt 172.0 lb

## 2015-03-18 DIAGNOSIS — B373 Candidiasis of vulva and vagina: Secondary | ICD-10-CM | POA: Diagnosis not present

## 2015-03-18 DIAGNOSIS — B3731 Acute candidiasis of vulva and vagina: Secondary | ICD-10-CM

## 2015-03-18 MED ORDER — FLUCONAZOLE 150 MG PO TABS: 150.0000 mg | ORAL_TABLET | Freq: Once | ORAL | Status: DC

## 2015-03-18 NOTE — Progress Notes (Signed)
Patient ID: Traci Jacobs, female   DOB: 1961-01-08, 54 y.o.   MRN: [id number] 54 y.o. Married Caucasian female G0P0 here with complaint of inguinal symptoms of itching and  burning that occurs mostly at night. Describes discharge as none. Onset of symptoms on and off for months. Denies new personal products and has used Triamcinolone given by dermatologist prn with some relief at times.  She does have vaginal dryness and uses lubrication prn. No STD concerns. Urinary symptoms none. Contraception is none.  She is having only light spotting every 2-3 months since coming off OCP.  Last time of spotting was a month ago.  Husband is having some erectile dysfunction problems.   O:  Healthy female WDWN Affect: normal, orientation x 3  Exam: alert and in no distress Abdomen: soft and non tender Lymph node: no enlargement or tenderness Pelvic exam: External genital: normal female with multiple inguinal areas of fungal infection.  Area with some linear cuts and scaliness. BUS: negative Vagina: no discharge noted.       A: External yeast vaginitis   P: Discussed findings of vaginitis and etiology. Discussed Aveeno or baking soda sitz bath for comfort. Avoid moist clothes or pads for extended period of time. If working out in gym clothes or swim suits for long periods of time change underwear or bottoms of swimsuit if possible. Olive Oil/Coconut Oil use for skin protection prior to activity can be used to external skin.  Rx: Diflucan 150 mg weekly X 4 - will call if symptoms persist  RV prn

## 2015-03-18 NOTE — Telephone Encounter (Signed)
Patient calling wanting to be seen for rash on inner things that has been there off/on since August of 2015. She said it's not painful but itches every once in a while and more so during the night. If there are any openings for today she is willing to be seen. Best contact # [phone]

## 2015-03-18 NOTE — Patient Instructions (Signed)

## 2015-03-18 NOTE — Telephone Encounter (Signed)
Patient states she has intermittent rash on inner thigh. Has seen dermatology and was prescribed Triamcinolone. She has tried to change types of underclothes to decrease pressure to the area but it has been worse and increases at night time.  Office visit today with Ria CommentPatricia Grubb, FNP at 1245 and is agreeable.  Routing to provider for final review. Patient agreeable to disposition. Will close encounter.

## 2015-03-19 NOTE — Progress Notes (Signed)
Encounter reviewed by Dr. Brook Amundson C. Silva.  

## 2015-04-06 ENCOUNTER — Telehealth: Payer: Self-pay | Admitting: Nurse Practitioner

## 2015-04-06 NOTE — Telephone Encounter (Signed)
Spoke with patient. Patient states that she have taken 3 Diflucan since she was seen. Patient was instructed to take 1 Diflucan every week for 4 weeks for vaginitis. Patient will take her 4th pill on Wednesday. "I am still having a lot of itching. It is less than it was before, but it it still there. I am worried this last pill will not get rid of it and I have to go out of town for work next week." Advised patient that I will speak with Ria CommentPatricia Grubb, FNP regarding her symptoms and return call with additonal recommendations. Patient is agreeable.

## 2015-04-06 NOTE — Telephone Encounter (Signed)
Patient has some questions regarding a prescription she is currently taking. Last sen 03/18/15.

## 2015-04-06 NOTE — Telephone Encounter (Signed)
Spoke with patient. Patient states that irritation she is experiencing is external. "It is where my hair is. I am more worried about the yeast not going away than anything else. If she feels this next pill will help I do not need anything. After 3 pills I am still having symptoms." Advised I will speak with covering provider and return call. Patient is agreeable.  Routing to Dr.Jertson for review as Ria CommentPatricia Grubb, FNP is out of the office the rest of the day.

## 2015-04-06 NOTE — Telephone Encounter (Signed)
Have her to use Premarin vaginal cream 1 gm twice a week - this will help with the atrophy symptoms and hopefully will relieve her current symptoms as she has dryness of the vulva.

## 2015-04-06 NOTE — Telephone Encounter (Signed)
Please have her come in for further evaluation

## 2015-04-07 NOTE — Telephone Encounter (Signed)
Spoke with patient. Advised of message as seen below from Dr.Jertson. Patient declines to schedule an appointment. Advised if symptoms persist after taking the last Diflucan she will need to be seen in the office for further evaluation. Patient states "I just don't understand. I have read that you may need more than just four doses of the Diflucan to clear this. Next time I just need to speak with Patty directly since I am going through you." Advised patient Diflucan is prescribed to treat yeast and after four doses this should be cleared if this is yeast related. May need a different treatment if symptoms are continuing and will need further evaluation if that is the case. Patient again declines. States she will take the last Diflucan tomorrow and return call if she would like to schedule an appointment.  Routing to provider for final review. Patient agreeable to disposition. Will close encounter.

## 2015-04-21 ENCOUNTER — Encounter: Payer: Self-pay | Admitting: Nurse Practitioner

## 2015-04-21 ENCOUNTER — Ambulatory Visit (INDEPENDENT_AMBULATORY_CARE_PROVIDER_SITE_OTHER): Payer: 59 | Admitting: Nurse Practitioner

## 2015-04-21 VITALS — BP 100/64 | HR 60 | Ht 67.0 in | Wt 166.0 lb

## 2015-04-21 DIAGNOSIS — N762 Acute vulvitis: Secondary | ICD-10-CM | POA: Diagnosis not present

## 2015-04-21 MED ORDER — FLUCONAZOLE 150 MG PO TABS: ORAL_TABLET | ORAL | Status: DC

## 2015-04-21 NOTE — Progress Notes (Signed)
54 y.o. Married Caucasian female G0P0 here with complaint of inguinal and vulvar symptoms of itching, burning in the inguinal areas.  She has completed the previous therapy of Diflucan weekly X 4 and symptoms went from a 9 down to 2.  Then after completion has slowly gone up again.  The worst symptoms are at night with a persistent itching.  Nothing externally will help.  She has used various soaps but has not noted any change or worsening with this. She does describe other places that itch such as her abdomen, hair line, leg, etc.  Denies seasonal allergies.  She has also reduced all sugar in her diet. previus treatment at the dermatologist with Triamcinolone without help.   Menses is irregular with LMP about a month ago. No STD concerns. Urinary symptoms none .    O:  Healthy female WDWN Affect: normal, orientation x 3  Exam: alert, no distress, seems frustrated Abdomen: soft and non tender without rash Lymph node: no enlargement or tenderness Pelvic exam: External genital: normal female with areas of redness in the groin and hair line bilaterally. BUS: negative Vagina: normal discharge noted.   Cervix: normal    A: Inguinal and vulvar irritation - consistent with recurrent yeast   P: Discussed findings of vaginitis and etiology. Discussed Aveeno or baking soda sitz bath for comfort. Avoid moist clothes or pads for extended period of time. If working out in gym clothes or swim suits for long periods of time change underwear or bottoms of swimsuit if possible. Olive Oil/Coconut Oil use for skin protection prior to activity can be used to external skin.  Rx: will refill Diflucan 150 mg weekly X 4  Advised Zyrtec at HS to help with generalized itching and she declines that she needs anything!   RV prn

## 2015-04-21 NOTE — Patient Instructions (Addendum)

## 2015-04-26 NOTE — Progress Notes (Signed)
Encounter reviewed by Dr. Brook Amundson C. Silva.  

## 2015-05-07 HISTORY — PX: COLONOSCOPY: SHX174

## 2015-06-25 ENCOUNTER — Other Ambulatory Visit: Payer: Self-pay | Admitting: Family Medicine

## 2015-06-25 DIAGNOSIS — Z0000 Encounter for general adult medical examination without abnormal findings: Secondary | ICD-10-CM

## 2015-07-24 ENCOUNTER — Other Ambulatory Visit (INDEPENDENT_AMBULATORY_CARE_PROVIDER_SITE_OTHER): Payer: 59

## 2015-07-24 DIAGNOSIS — Z0000 Encounter for general adult medical examination without abnormal findings: Secondary | ICD-10-CM

## 2015-07-24 LAB — CBC WITH DIFFERENTIAL/PLATELET
BASOS ABS: 0.1 10*3/uL (ref 0.0–0.1)
BASOS PCT: 0.8 % (ref 0.0–3.0)
EOS ABS: 0.3 10*3/uL (ref 0.0–0.7)
Eosinophils Relative: 2.8 % (ref 0.0–5.0)
HEMATOCRIT: 44 % (ref 36.0–46.0)
HEMOGLOBIN: 14.6 g/dL (ref 12.0–15.0)
LYMPHS PCT: 19.4 % (ref 12.0–46.0)
Lymphs Abs: 2.1 10*3/uL (ref 0.7–4.0)
MCHC: 33.2 g/dL (ref 30.0–36.0)
MCV: 91.7 fl (ref 78.0–100.0)
MONOS PCT: 6.1 % (ref 3.0–12.0)
Monocytes Absolute: 0.7 10*3/uL (ref 0.1–1.0)
NEUTROS ABS: 7.6 10*3/uL (ref 1.4–7.7)
Neutrophils Relative %: 70.9 % (ref 43.0–77.0)
Platelets: 290 10*3/uL (ref 150.0–400.0)
RBC: 4.79 Mil/uL (ref 3.87–5.11)
RDW: 14.1 % (ref 11.5–15.5)
WBC: 10.7 10*3/uL — AB (ref 4.0–10.5)

## 2015-07-24 LAB — LIPID PANEL
CHOL/HDL RATIO: 3
Cholesterol: 207 mg/dL — ABNORMAL HIGH (ref 0–200)
HDL: 75.2 mg/dL (ref >39.00)
LDL CALC: 114 mg/dL — AB (ref 0–99)
NonHDL: 131.36
Triglycerides: 86 mg/dL (ref 0.0–149.0)
VLDL: 17.2 mg/dL (ref 0.0–40.0)

## 2015-07-24 LAB — COMPREHENSIVE METABOLIC PANEL
ALT: 19 U/L (ref 0–35)
AST: 16 U/L (ref 0–37)
Albumin: 4.5 g/dL (ref 3.5–5.2)
Alkaline Phosphatase: 51 U/L (ref 39–117)
BUN: 13 mg/dL (ref 6–23)
CHLORIDE: 102 meq/L (ref 96–112)
CO2: 29 mEq/L (ref 19–32)
Calcium: 9.3 mg/dL (ref 8.4–10.5)
Creatinine, Ser: 0.62 mg/dL (ref 0.40–1.20)
GFR: 106.64 mL/min (ref >60.00)
GLUCOSE: 81 mg/dL (ref 70–99)
POTASSIUM: 3.8 meq/L (ref 3.5–5.1)
SODIUM: 137 meq/L (ref 135–145)
Total Bilirubin: 0.9 mg/dL (ref 0.2–1.2)
Total Protein: 7.2 g/dL (ref 6.0–8.3)

## 2015-07-24 LAB — TSH: TSH: 1.19 u[IU]/mL (ref 0.35–4.50)

## 2015-07-29 ENCOUNTER — Encounter: Payer: Self-pay | Admitting: Family Medicine

## 2015-07-29 ENCOUNTER — Ambulatory Visit (INDEPENDENT_AMBULATORY_CARE_PROVIDER_SITE_OTHER): Payer: 59 | Admitting: Family Medicine

## 2015-07-29 VITALS — BP 127/90 | HR 67 | Temp 98.8°F | Resp 16 | Ht 66.0 in | Wt 163.5 lb

## 2015-07-29 DIAGNOSIS — Z0000 Encounter for general adult medical examination without abnormal findings: Secondary | ICD-10-CM | POA: Diagnosis not present

## 2015-07-29 NOTE — Progress Notes (Signed)
Office Note 07/29/2015  CC:  Chief Complaint  Patient presents with  . Establish Care  . Annual Exam    Pt is not fasting. Labs done 07/24/15.     HPI:  Traci Jacobs is a 55 y.o. White female who is here for annual health maintenance exam. She gets pap/pelvic via her GYN and this was last done 10/23/14.  Mammo due 10/2015. She has only been seen here in our office once before and that was 08/14/14 by our former NP Maximino Sarin for an elbow tendonitis.  She eats an excellent diet.  We reviewed her labs in detail today and they were all good. No acute complaints.    Past Medical History  Diagnosis Date  . Tachycardia 06/2007    NEG WORK UP  . Anal fissure   . Atypical endocervical cells on Pap smear 2011    NEG WORK UP    Past Surgical History  Procedure Laterality Date  . Dilation and curettage of uterus  2011    ATYPICAL ENDOMETRIAL CELLS ON PAP BENIGN PATH  . Cauterize inner nose  1997  . Hysteroscopy  2011    negative  . Colonoscopy  05/2015    [facility]    Family History  Problem Relation Age of Onset  . Hypertension Mother   . Diverticulitis Mother   . Heart disease Maternal Aunt   . Breast cancer Paternal Aunt   . Heart disease Maternal Grandmother   . Breast cancer Cousin   . Diabetes Brother   . Obesity Brother   . Depression Sister   . Obesity Sister   . Stomach cancer Maternal Grandfather     Social History   Social History  . Marital Status: Married    Spouse Name: N/A  . Number of Children: 0  . Years of Education: N/A   Occupational History  .      business Technical sales engineer   Social History Main Topics  . Smoking status: Former Smoker -- 0 years    Types: Cigars  . Smokeless tobacco: Never Used  . Alcohol Use: 3.6 oz/week    6 Glasses of wine per week  . Drug Use: No  . Sexual Activity:    Partners: Male   Other Topics Concern  . Not on file   Social History Narrative   Ms Ebert lives with her husband. She works Teacher, English as a foreign language.     Outpatient Prescriptions Prior to Visit  Medication Sig Dispense Refill  . Multiple Vitamin (MULTIVITAMIN) tablet Take 1 tablet by mouth daily.      . fluconazole (DIFLUCAN) 150 MG tablet Take one tablet weekly X 4 (Patient not taking: Reported on 07/29/2015) 4 tablet 0   No facility-administered medications prior to visit.    Allergies  Allergen Reactions  . Guaifenesin Er Hives, Itching and Rash    ROS Review of Systems  Constitutional: Negative for fever, chills, appetite change and fatigue.  HENT: Negative for congestion, dental problem, ear pain and sore throat.   Eyes: Negative for discharge, redness and visual disturbance.  Respiratory: Negative for cough, chest tightness, shortness of breath and wheezing.   Cardiovascular: Negative for chest pain, palpitations and leg swelling.  Gastrointestinal: Negative for nausea, vomiting, abdominal pain, diarrhea and blood in stool.  Genitourinary: Negative for dysuria, urgency, frequency, hematuria, flank pain and difficulty urinating.  Musculoskeletal: Negative for myalgias, back pain, joint swelling, arthralgias and neck stiffness.  Skin: Negative for pallor and rash.  Neurological: Negative for dizziness, speech difficulty,  weakness and headaches.  Hematological: Negative for adenopathy. Does not bruise/bleed easily.  Psychiatric/Behavioral: Negative for confusion and sleep disturbance. The patient is not nervous/anxious.     PE; Blood pressure 127/90, pulse 67, temperature 98.8 F (37.1 C), temperature source Oral, resp. rate 16, height  (1.676 m), weight 163 lb 8 oz (74.163 kg), SpO2 100 %. Gen: Alert, well appearing.  Patient is oriented to person, place, time, and situation. AFFECT: pleasant, lucid thought and speech. ENT: Ears: EACs clear, normal epithelium.  TMs with good light reflex and landmarks bilaterally.  Eyes: no injection, icteris, swelling, or exudate.  EOMI, PERRLA. Nose: no drainage or turbinate  edema/swelling.  No injection or focal lesion.  Mouth: lips without lesion/swelling.  Oral mucosa pink and moist.  Dentition intact and without obvious caries or gingival swelling.  Oropharynx without erythema, exudate, or swelling.  Neck: supple/nontender.  No LAD, mass, or TM.  Carotid pulses 2+ bilaterally, without bruits. CV: RRR, no m/r/g.   LUNGS: CTA bilat, nonlabored resps, good aeration in all lung fields. ABD: soft, NT, ND, BS normal.  No hepatospenomegaly or mass.  No bruits. EXT: no clubbing, cyanosis, or edema.  Musculoskeletal: no joint swelling, erythema, warmth, or tenderness.  ROM of all joints intact. Skin - no sores or suspicious lesions or rashes or color changes  Pertinent labs:  Lab Results  Component Value Date   TSH 1.19 07/24/2015   Lab Results  Component Value Date   WBC 10.7* 07/24/2015   HGB 14.6 07/24/2015   HCT 44.0 07/24/2015   MCV 91.7 07/24/2015   PLT 290.0 07/24/2015   Lab Results  Component Value Date   CREATININE 0.62 07/24/2015   BUN 13 07/24/2015   NA 137 07/24/2015   K 3.8 07/24/2015   CL 102 07/24/2015   CO2 29 07/24/2015   Lab Results  Component Value Date   ALT 19 07/24/2015   AST 16 07/24/2015   ALKPHOS 51 07/24/2015   BILITOT 0.9 07/24/2015   Lab Results  Component Value Date   CHOL 207* 07/24/2015   Lab Results  Component Value Date   HDL 75.20 07/24/2015   Lab Results  Component Value Date   LDLCALC 114* 07/24/2015   Lab Results  Component Value Date   TRIG 86.0 07/24/2015   Lab Results  Component Value Date   CHOLHDL 3 07/24/2015   ASSESSMENT AND PLAN:   Health maintenance exam: Reviewed age and gender appropriate health maintenance issues (prudent diet, regular exercise, health risks of tobacco and excessive alcohol, use of seatbelts, fire alarms in home, use of sunscreen).  Also reviewed age and gender appropriate health screening as well as vaccine recommendations. She declines flu vaccine. HP labs  reviewed in detail with pt: all normal. Colon cancer screening UTD: next colonoscopy after 05/2025. Cerv ca screening UTD.  Breast ca screening: mammo due 10/2015.  An After Visit Summary was printed and given to the patient.  FOLLOW UP:  Return in about 1 year (around 07/28/2016) for annual CPE (fasting).

## 2015-07-29 NOTE — Progress Notes (Signed)
Pre visit review using our clinic review tool, if applicable. No additional management support is needed unless otherwise documented below in the visit note. 

## 2015-09-14 ENCOUNTER — Other Ambulatory Visit: Payer: Self-pay

## 2015-09-14 DIAGNOSIS — Z1231 Encounter for screening mammogram for malignant neoplasm of breast: Secondary | ICD-10-CM

## 2015-10-14 ENCOUNTER — Ambulatory Visit: Payer: 59

## 2015-10-28 ENCOUNTER — Ambulatory Visit (INDEPENDENT_AMBULATORY_CARE_PROVIDER_SITE_OTHER): Payer: 59 | Admitting: Nurse Practitioner

## 2015-10-28 ENCOUNTER — Encounter: Payer: Self-pay | Admitting: Nurse Practitioner

## 2015-10-28 VITALS — BP 110/66 | HR 60 | Ht 66.0 in | Wt 163.0 lb

## 2015-10-28 DIAGNOSIS — Z01419 Encounter for gynecological examination (general) (routine) without abnormal findings: Secondary | ICD-10-CM | POA: Diagnosis not present

## 2015-10-28 DIAGNOSIS — Z0000 Encounter for general adult medical examination without abnormal findings: Secondary | ICD-10-CM | POA: Diagnosis not present

## 2015-10-28 LAB — HIV ANTIBODY (ROUTINE TESTING W REFLEX): HIV 1&2 Ab, 4th Generation: NONREACTIVE

## 2015-10-28 NOTE — Patient Instructions (Addendum)

## 2015-10-28 NOTE — Progress Notes (Signed)
Patient ID: Traci Jacobs, female   DOB: [birth year], 55 y.o.   MRN: [id number]  55 y.o. G0P0000 Married  Caucasian Fe here for annual exam.  Still having monthly spotting for 1 -2 days.  No vaginal dryness.  Not SA.  Less yeast symptoms since off OCP.  Patient's last menstrual period was 09/19/2015.          Sexually active: No.  The current method of family planning is abstinence.    Exercising: Yes.    Running Monday and Friday, Yoga on Wednesday, weightlifting on Tuesday and Thursday and running on trampoline on weekends Smoker:  no  Health Maintenance: Pap:10/23/14, negative with neg HR HPV MMG: 10/17/14, 3D, Bi-Rads 1: Negative; scheduled for 11/24/15 Colonoscopy:05/07/2015 for anal fissures -  normal 10 yr repeat TDaP: UTD Pneumonia: Not indicated due to age Hep C and HIV: done today Labs: 07/24/15 in EPIC  Urine: negative   reports that she has quit smoking. Her smoking use included Cigars. She has never used smokeless tobacco. She reports that she drinks about 3.6 oz of alcohol per week. She reports that she does not use illicit drugs.  Past Medical History  Diagnosis Date  . Tachycardia 06/2007    NEG WORK UP  . Anal fissure   . Atypical endocervical cells on Pap smear 2011    NEG WORK UP    Past Surgical History  Procedure Laterality Date  . Dilation and curettage of uterus  2011    ATYPICAL ENDOMETRIAL CELLS ON PAP BENIGN PATH  . Cauterize inner nose  1997  . Hysteroscopy  2011    negative  . Colonoscopy  05/2015    Dr. Kinnie Scales (normal-recall 10 yrs)    Current Outpatient Prescriptions  Medication Sig Dispense Refill  . Multiple Vitamin (MULTIVITAMIN) tablet Take 1 tablet by mouth daily.       No current facility-administered medications for this visit.    Family History  Problem Relation Age of Onset  . Hypertension Mother   . Diverticulitis Mother   . Heart disease Maternal Aunt   . Breast cancer Paternal Aunt   . Heart disease Maternal Grandmother    . Breast cancer Cousin   . Diabetes Brother   . Obesity Brother   . Depression Sister   . Obesity Sister   . Stomach cancer Maternal Grandfather     ROS:  Pertinent items are noted in HPI.  Otherwise, a comprehensive ROS was negative.  Exam:   BP 110/66 mmHg  Pulse 60  Ht  (1.676 m)  Wt 163 lb (73.936 kg)  BMI 26.32 kg/m2  LMP 09/19/2015 Height:  (167.6 cm) Ht Readings from Last 3 Encounters:  10/28/15  (1.676 m)  07/29/15  (1.676 m)  04/21/15  (1.702 m)    General appearance: alert, cooperative and appears stated age Head: Normocephalic, without obvious abnormality, atraumatic Neck: no adenopathy, supple, symmetrical, trachea midline and thyroid normal to inspection and palpation Lungs: clear to auscultation bilaterally Breasts: normal appearance, no masses or tenderness Heart: regular rate and rhythm Abdomen: soft, non-tender; no masses,  no organomegaly Extremities: extremities normal, atraumatic, no cyanosis or edema Skin: Skin color, texture, turgor normal. No rashes or lesions Lymph nodes: Cervical, supraclavicular, and axillary nodes normal. No abnormal inguinal nodes palpated Neurologic: Grossly normal   Pelvic: External genitalia:  no lesions              Urethra:  normal appearing urethra with no masses, tenderness  or lesions              Bartholin's and Skene's: normal                 Vagina: normal appearing vagina with normal color and discharge, no lesions              Cervix: anteverted              Pap taken: No. Bimanual Exam:  Uterus:  normal size, contour, position, consistency, mobility, non-tender              Adnexa: no mass, fullness, tenderness               Rectovaginal: declines               Anus:  declines  Chaperone present: no  A:  Well Woman with normal exam  Perimenopausal with tolerable symptoms History of anal fissure Remote history of atypical endo cells on pap with negative work  up 2011  P:   Reviewed health and wellness pertinent to exam  Pap smear as above  Mammogram is due and scheduled 11/24/15  Declines a repeat FSH  Declines any hormonal treatment   Counseled on breast self exam, mammography screening, adequate intake of calcium and vitamin D, diet and exercise return annually or prn  An After Visit Summary was printed and given to the patient.

## 2015-10-29 ENCOUNTER — Telehealth: Payer: Self-pay | Admitting: *Deleted

## 2015-10-29 LAB — HEPATITIS C ANTIBODY: HCV AB: NEGATIVE

## 2015-10-29 NOTE — Telephone Encounter (Signed)
Pt notified in result note.  Closing encounter. 

## 2015-10-29 NOTE — Telephone Encounter (Signed)
I have attempted to contact this patient by phone with the following results: left message to return call to HackberryStephanie at [phone] on answering machine (mobile [facility]).  No personal information given.  [phone] (Mobile) *Preferred*

## 2015-10-29 NOTE — Telephone Encounter (Signed)
-----   Message from Verner Choleborah S Leonard, CNM sent at 10/29/2015  3:38 AM EDT ----- Notify Hep C and HIV are neg

## 2015-11-02 NOTE — Progress Notes (Signed)
Encounter reviewed by Dr. Brook Amundson C. Silva.  

## 2015-11-24 ENCOUNTER — Other Ambulatory Visit: Payer: Self-pay | Admitting: Obstetrics & Gynecology

## 2015-11-24 ENCOUNTER — Ambulatory Visit
Admission: RE | Admit: 2015-11-24 | Discharge: 2015-11-24 | Disposition: A | Discharge status: Home / Self Care | Payer: 59 | Source: Ambulatory Visit

## 2015-11-24 DIAGNOSIS — Z1231 Encounter for screening mammogram for malignant neoplasm of breast: Secondary | ICD-10-CM

## 2016-09-08 ENCOUNTER — Other Ambulatory Visit: Payer: Self-pay | Admitting: Obstetrics & Gynecology

## 2016-09-08 DIAGNOSIS — Z1231 Encounter for screening mammogram for malignant neoplasm of breast: Secondary | ICD-10-CM

## 2016-10-19 ENCOUNTER — Ambulatory Visit
Admission: RE | Admit: 2016-10-19 | Discharge: 2016-10-19 | Disposition: A | Discharge status: Home / Self Care | Payer: 59 | Source: Ambulatory Visit | Attending: Obstetrics & Gynecology | Admitting: Obstetrics & Gynecology

## 2016-10-19 DIAGNOSIS — Z1231 Encounter for screening mammogram for malignant neoplasm of breast: Secondary | ICD-10-CM

## 2016-10-25 ENCOUNTER — Ambulatory Visit (INDEPENDENT_AMBULATORY_CARE_PROVIDER_SITE_OTHER): Payer: 59 | Admitting: Nurse Practitioner

## 2016-10-25 ENCOUNTER — Other Ambulatory Visit (HOSPITAL_COMMUNITY)
Admission: RE | Admit: 2016-10-25 | Discharge: 2016-10-25 | Disposition: A | Discharge status: Home / Self Care | Payer: 59 | Source: Ambulatory Visit | Attending: Obstetrics & Gynecology | Admitting: Obstetrics & Gynecology

## 2016-10-25 ENCOUNTER — Encounter: Payer: Self-pay | Admitting: Nurse Practitioner

## 2016-10-25 VITALS — BP 124/78 | HR 88 | Resp 20 | Ht 66.0 in | Wt 170.4 lb

## 2016-10-25 DIAGNOSIS — Z01419 Encounter for gynecological examination (general) (routine) without abnormal findings: Secondary | ICD-10-CM | POA: Insufficient documentation

## 2016-10-25 DIAGNOSIS — Z0000 Encounter for general adult medical examination without abnormal findings: Secondary | ICD-10-CM

## 2016-10-25 NOTE — Progress Notes (Deleted)
She needs FSH and either endometrial biopsy or ultrasound.  Reviewed personally.  Lum KeasM. Suzanne Bettylee Feig, MD.

## 2016-10-25 NOTE — Patient Instructions (Signed)

## 2016-10-25 NOTE — Progress Notes (Signed)
Pt needs FSH and endometrial biopsy or PUS for additional evaluation.  Reviewed personally.  Lum KeasM. Suzanne Tylek Boney, MD.

## 2016-10-25 NOTE — Progress Notes (Addendum)
56 y.o. G0P0000 Married  Caucasian Fe here for annual exam.  Some spotting this past year that was not regular but occurred every few months.  Then has spotting in November followed by amenorrhea X 5 months.  Then 10/18/16 had again spotting for a few days.  In January and February the vaso symptoms were bad  Then in March symptoms were better.  No insomnia and feels vaso symptoms are tolerable.   She came off OCP late 2015 due to breast tenderness and heaviness.   She is married but not SA.   Patient's last menstrual period was 10/18/2016.          Sexually active: No.  The current method of family planning is perimenopause.    Exercising: Yes.    Run, walk, yoga, weights Smoker:  no  Health Maintenance: Pap:10/23/14, negative with neg HR HPV; 11/23/12 Neg MMG: 10/19/16 BIRADS1, [facility] Self Breast exams: no Colonoscopy:05/07/2015 for anal fissures -  normal 10 yr repeat TDaP: UTD Pneumonia: Not indicated due to age Hep C and HIV: 10/28/15 Neg Labs: does not like giving blood - declines any labs     reports that she has quit smoking. Her smoking use included Cigars. She quit after 0.00 years of use. She has never used smokeless tobacco. She reports that she drinks about 3.6 oz of alcohol per week . She reports that she does not use drugs.  Past Medical History:  Diagnosis Date  . Anal fissure   . Atypical endocervical cells on Pap smear 2011   NEG WORK UP  . Tachycardia 06/2007   NEG WORK UP    Past Surgical History:  Procedure Laterality Date  . CAUTERIZE INNER NOSE  1997  . COLONOSCOPY  05/2015   Dr. Kinnie Scales (normal-recall 10 yrs)  . DILATION AND CURETTAGE OF UTERUS  2011   ATYPICAL ENDOMETRIAL CELLS ON PAP BENIGN PATH  . HYSTEROSCOPY  2011   negative    Current Outpatient Prescriptions  Medication Sig Dispense Refill  . Multiple Vitamin (MULTIVITAMIN) tablet Take 1 tablet by mouth daily.       No current facility-administered medications for this visit.      Family History  Problem Relation Age of Onset  . Hypertension Mother   . Diverticulitis Mother   . Heart disease Maternal Grandmother   . Diabetes Brother   . Obesity Brother   . Depression Sister   . Obesity Sister   . Stomach cancer Maternal Grandfather   . Heart disease Maternal Aunt   . Breast cancer Maternal Aunt   . Breast cancer Paternal Aunt   . Breast cancer Cousin     ROS:  Pertinent items are noted in HPI.  Otherwise, a comprehensive ROS was negative.  Exam:   BP 124/78 (BP Location: Right Arm, Patient Position: Sitting, Cuff Size: Normal)   Pulse 88   Resp 20   Ht 5\' 6"  (1.676 m)   Wt 170 lb 6.4 oz (77.3 kg)   LMP 10/18/2016 Comment: Spotting  BMI 27.50 kg/m  Height: 5\' 6"  (167.6 cm) Ht Readings from Last 3 Encounters:  10/25/16 5\' 6"  (1.676 m)  10/28/15 5\' 6"  (1.676 m)  07/29/15 5\' 6"  (1.676 m)    General appearance: alert, cooperative and appears stated age Head: Normocephalic, without obvious abnormality, atraumatic Neck: no adenopathy, supple, symmetrical, trachea midline and thyroid normal to inspection and palpation Lungs: clear to auscultation bilaterally Breasts: pt declines any breast exam because she had mammogram done  last week. Heart: regular rate and rhythm Abdomen: soft, non-tender; no masses,  no organomegaly Extremities: extremities normal, atraumatic, no cyanosis or edema Skin: Skin color, texture, turgor normal. No rashes or lesions Lymph nodes: Cervical, supraclavicular, and axillary nodes normal. No abnormal inguinal nodes palpated Neurologic: Grossly normal   Pelvic: External genitalia:  no lesions              Urethra:  normal appearing urethra with no masses, tenderness or lesions              Bartholin's and Skene's: normal                 Vagina: normal appearing vagina with normal color and discharge, no lesions              Cervix: anteverted              Pap taken: Yes.   Bimanual Exam:  Uterus:  normal size, contour,  position, consistency, mobility, non-tender              Adnexa: no mass, fullness, tenderness               Rectovaginal: declines this exam               Anus:  Declines this exam  Chaperone present: yes  A:  Well Woman with normal exam             Perimenopausal with tolerable symptoms  Atypical menses history History of anal fissure Remote history of atypical endo cells on pap with negative work up 2011   P:   Reviewed health and wellness pertinent to exam  Pap smear: yes  Mammogram is due 10/2017  Discussed rationale for breast exam in addition to Mammo - she did not deem necessary!  Discussed concern about irregular bleeding even though this her "normal" - reviewed rationale about endo hyperplasia and other risk factors which she does not have such as DM, HTN, obesity.  She declines any Provera challenge.  She really declines FSH level to verify perimenopause or menopausal status.  Did do a pap with her history of atypical endo cells on previous pap with negative eval in 2011.  Discussed rectal exam and history of anal fissures - again declined.   Counseled on breast self exam, mammography screening, adequate intake of calcium and vitamin D, diet and exercise, Kegel's exercises return annually or prn  An After Visit Summary was printed and given to the patient.

## 2016-10-26 LAB — CYTOLOGY - PAP: Diagnosis: NEGATIVE

## 2016-11-09 ENCOUNTER — Telehealth: Payer: Self-pay | Admitting: Nurse Practitioner

## 2016-11-09 NOTE — Telephone Encounter (Signed)
Left a message only to call me back.

## 2016-11-09 NOTE — Telephone Encounter (Signed)
[facility] or [facility] per Dr. Hyacinth MeekerMiller.  Pt states this is the same pattern that her 2 sisters and mother had with similar events at about the same age.  Again discussed the concerns about endometrial thickening and risk for cancer.  She does understand and will be keeping a calendar.  She then states that all of cost for her would be out of pocket due to her insurance coverage and did not want to pursue testing.  She is instructed to call if any changes. She was appreciative of call and understands our concerns.

## 2016-11-09 NOTE — Telephone Encounter (Signed)
Patient returning call.

## 2017-01-09 ENCOUNTER — Telehealth: Payer: Self-pay | Admitting: Obstetrics and Gynecology

## 2017-01-09 NOTE — Telephone Encounter (Signed)
Left message on voicemail to call and reschedule cancelled appointment. Mail letter °

## 2017-08-08 ENCOUNTER — Ambulatory Visit (INDEPENDENT_AMBULATORY_CARE_PROVIDER_SITE_OTHER): Payer: 59 | Admitting: Family Medicine

## 2017-08-08 ENCOUNTER — Other Ambulatory Visit: Payer: Self-pay | Admitting: Certified Nurse Midwife

## 2017-08-08 ENCOUNTER — Other Ambulatory Visit: Payer: Self-pay | Admitting: Obstetrics & Gynecology

## 2017-08-08 ENCOUNTER — Other Ambulatory Visit: Payer: Self-pay

## 2017-08-08 ENCOUNTER — Encounter: Payer: Self-pay | Admitting: Family Medicine

## 2017-08-08 VITALS — BP 139/82 | HR 76 | Temp 97.9°F | Resp 16 | Ht 66.0 in | Wt 161.0 lb

## 2017-08-08 DIAGNOSIS — Z0000 Encounter for general adult medical examination without abnormal findings: Secondary | ICD-10-CM

## 2017-08-08 DIAGNOSIS — Z139 Encounter for screening, unspecified: Secondary | ICD-10-CM

## 2017-08-08 LAB — CBC WITH DIFFERENTIAL/PLATELET
Basophils Absolute: 0.1 10*3/uL (ref 0.0–0.1)
Basophils Relative: 1.3 % (ref 0.0–3.0)
EOS PCT: 2.4 % (ref 0.0–5.0)
Eosinophils Absolute: 0.1 10*3/uL (ref 0.0–0.7)
HCT: 43.9 % (ref 36.0–46.0)
Hemoglobin: 14.6 g/dL (ref 12.0–15.0)
LYMPHS ABS: 1.8 10*3/uL (ref 0.7–4.0)
Lymphocytes Relative: 31 % (ref 12.0–46.0)
MCHC: 33.2 g/dL (ref 30.0–36.0)
MCV: 92 fl (ref 78.0–100.0)
MONO ABS: 0.4 10*3/uL (ref 0.1–1.0)
MONOS PCT: 6.7 % (ref 3.0–12.0)
NEUTROS ABS: 3.5 10*3/uL (ref 1.4–7.7)
NEUTROS PCT: 58.6 % (ref 43.0–77.0)
Platelets: 302 10*3/uL (ref 150.0–400.0)
RBC: 4.77 Mil/uL (ref 3.87–5.11)
RDW: 13.6 % (ref 11.5–15.5)
WBC: 5.9 10*3/uL (ref 4.0–10.5)

## 2017-08-08 LAB — COMPREHENSIVE METABOLIC PANEL
ALK PHOS: 53 U/L (ref 39–117)
ALT: 13 U/L (ref 0–35)
AST: 14 U/L (ref 0–37)
Albumin: 4.6 g/dL (ref 3.5–5.2)
BILIRUBIN TOTAL: 0.7 mg/dL (ref 0.2–1.2)
BUN: 9 mg/dL (ref 6–23)
CO2: 29 meq/L (ref 19–32)
Calcium: 10.2 mg/dL (ref 8.4–10.5)
Chloride: 103 mEq/L (ref 96–112)
Creatinine, Ser: 0.68 mg/dL (ref 0.40–1.20)
GFR: 95.14 mL/min (ref >60.00)
GLUCOSE: 92 mg/dL (ref 70–99)
Potassium: 4.2 mEq/L (ref 3.5–5.1)
SODIUM: 140 meq/L (ref 135–145)
TOTAL PROTEIN: 7.5 g/dL (ref 6.0–8.3)

## 2017-08-08 LAB — LIPID PANEL
CHOL/HDL RATIO: 3
Cholesterol: 226 mg/dL — ABNORMAL HIGH (ref 0–200)
HDL: 76.2 mg/dL (ref >39.00)
LDL Cholesterol: 134 mg/dL — ABNORMAL HIGH (ref 0–99)
NONHDL: 149.96
Triglycerides: 78 mg/dL (ref 0.0–149.0)
VLDL: 15.6 mg/dL (ref 0.0–40.0)

## 2017-08-08 LAB — TSH: TSH: 0.91 u[IU]/mL (ref 0.35–4.50)

## 2017-08-08 NOTE — Progress Notes (Signed)
Office Note 08/08/2017  CC:  Chief Complaint  Patient presents with  . Annual Exam    HPI:  Traci Jacobs is a 57 y.o. female who is here for annual health maintenance exam. Feels well, no acute complaint.  Exercise: daily--yoga, wt lifting, jogging, trampoline. Diet: describes healthy diet. She has lost 10lb wt purposefully in the last 3 mo.  Past Medical History:  Diagnosis Date  . Anal fissure   . Atypical endocervical cells on Pap smear 2011   NEG WORK UP  . Tachycardia 06/2007   NEG WORK UP    Past Surgical History:  Procedure Laterality Date  . CAUTERIZE INNER NOSE  1997  . COLONOSCOPY  05/2015   Dr. Kinnie Scales (normal-recall 10 yrs)  . DILATION AND CURETTAGE OF UTERUS  2011   ATYPICAL ENDOMETRIAL CELLS ON PAP BENIGN PATH  . HYSTEROSCOPY  2011   negative    Family History  Problem Relation Age of Onset  . Hypertension Mother   . Diverticulitis Mother   . Heart disease Maternal Grandmother   . Diabetes Brother   . Obesity Brother   . Depression Sister   . Obesity Sister   . Stomach cancer Maternal Grandfather   . Heart disease Maternal Aunt   . Breast cancer Maternal Aunt   . Breast cancer Paternal Aunt   . Breast cancer Cousin     Social History   Socioeconomic History  . Marital status: Married    Spouse name: Not on file  . Number of children: 0  . Years of education: Not on file  . Highest education level: Not on file  Social Needs  . Financial resource strain: Not on file  . Food insecurity - worry: Not on file  . Food insecurity - inability: Not on file  . Transportation needs - medical: Not on file  . Transportation needs - non-medical: Not on file  Occupational History    Comment: Chief Executive Officer  Tobacco Use  . Smoking status: Former Smoker    Years: 0.00    Types: Cigars  . Smokeless tobacco: Never Used  Substance and Sexual Activity  . Alcohol use: Yes    Alcohol/week: 3.6 oz    Types: 6 Glasses of wine per week  .  Drug use: No  . Sexual activity: Not Currently    Partners: Male    Birth control/protection: Post-menopausal  Other Topics Concern  . Not on file  Social History Narrative   Ms Pease lives with her husband. She works Teacher, English as a foreign language.    Outpatient Medications Prior to Visit  Medication Sig Dispense Refill  . Multiple Vitamin (MULTIVITAMIN) tablet Take 1 tablet by mouth daily.       No facility-administered medications prior to visit.     Allergies  Allergen Reactions  . Guaifenesin Er Hives, Itching and Rash    ROS Review of Systems  Constitutional: Negative for appetite change, chills, fatigue and fever.  HENT: Negative for congestion, dental problem, ear pain and sore throat.   Eyes: Negative for discharge, redness and visual disturbance.  Respiratory: Negative for cough, chest tightness, shortness of breath and wheezing.   Cardiovascular: Negative for chest pain, palpitations and leg swelling.  Gastrointestinal: Negative for abdominal pain, blood in stool, diarrhea, nausea and vomiting.  Genitourinary: Negative for difficulty urinating, dysuria, flank pain, frequency, hematuria and urgency.  Musculoskeletal: Negative for arthralgias, back pain, joint swelling, myalgias and neck stiffness.  Skin: Negative for pallor and rash.  Neurological: Negative for dizziness,  speech difficulty, weakness and headaches.  Hematological: Negative for adenopathy. Does not bruise/bleed easily.  Psychiatric/Behavioral: Negative for confusion and sleep disturbance. The patient is not nervous/anxious.     PE; Blood pressure 139/82, pulse 76, temperature 97.9 F (36.6 C), temperature source Oral, resp. rate 16, height 5\' 6"  (1.676 m), weight 161 lb (73 kg), SpO2 100 %. Body mass index is 25.99 kg/m. Exam chaperoned by Vanetta MuldersLaura Ray, CMA.  Gen: Alert, well appearing.  Patient is oriented to person, place, time, and situation. AFFECT: pleasant, lucid thought and speech. ENT: Ears: EACs clear, normal  epithelium.  TMs with good light reflex and landmarks bilaterally.  Eyes: no injection, icteris, swelling, or exudate.  EOMI, PERRLA. Nose: no drainage or turbinate edema/swelling.  No injection or focal lesion.  Mouth: lips without lesion/swelling.  Oral mucosa pink and moist.  Dentition intact and without obvious caries or gingival swelling.  Oropharynx without erythema, exudate, or swelling.  Neck: supple/nontender.  No LAD, mass, or TM.  Carotid pulses 2+ bilaterally, without bruits. CV: RRR, no m/r/g.   LUNGS: CTA bilat, nonlabored resps, good aeration in all lung fields. ABD: soft, NT, ND, BS normal.  No hepatospenomegaly or mass.  No bruits. EXT: no clubbing, cyanosis, or edema.  Musculoskeletal: no joint swelling, erythema, warmth, or tenderness.  ROM of all joints intact. Skin - no sores or suspicious lesions or rashes or color changes   Pertinent labs:  Lab Results  Component Value Date   TSH 1.19 07/24/2015   Lab Results  Component Value Date   WBC 10.7 (H) 07/24/2015   HGB 14.6 07/24/2015   HCT 44.0 07/24/2015   MCV 91.7 07/24/2015   PLT 290.0 07/24/2015   Lab Results  Component Value Date   CREATININE 0.62 07/24/2015   BUN 13 07/24/2015   NA 137 07/24/2015   K 3.8 07/24/2015   CL 102 07/24/2015   CO2 29 07/24/2015   Lab Results  Component Value Date   ALT 19 07/24/2015   AST 16 07/24/2015   ALKPHOS 51 07/24/2015   BILITOT 0.9 07/24/2015   Lab Results  Component Value Date   CHOL 207 (H) 07/24/2015   Lab Results  Component Value Date   HDL 75.20 07/24/2015   Lab Results  Component Value Date   LDLCALC 114 (H) 07/24/2015   Lab Results  Component Value Date   TRIG 86.0 07/24/2015   Lab Results  Component Value Date   CHOLHDL 3 07/24/2015     ASSESSMENT AND PLAN:   Health maintenance exam: Reviewed age and gender appropriate health maintenance issues (prudent diet, regular exercise, health risks of tobacco and excessive alcohol, use of  seatbelts, fire alarms in home, use of sunscreen).  Also reviewed age and gender appropriate health screening as well as vaccine recommendations. Vaccines: Flu vaccine--declined flu vaccine today.   Shingrix--declined at this time. Labs: Fasting HP. Cervical ca screening: most recent pap via GYN was 10/25/16. Breast ca screening: Most recent mammo via GYN was 10/19/16. Colon ca screening: next screening colonoscopy due 05/2025.  However, pt will seek 5 yr f/u with GI due to insurance reasons.  An After Visit Summary was printed and given to the patient.  FOLLOW UP:  Return in about 1 year (around 08/09/2018) for annual CPE (fasting).  Signed:  Santiago BumpersPhil Cristol Engdahl, MD           08/08/2017

## 2017-08-08 NOTE — Patient Instructions (Signed)

## 2017-10-20 ENCOUNTER — Ambulatory Visit
Admission: RE | Admit: 2017-10-20 | Discharge: 2017-10-20 | Disposition: A | Discharge status: Home / Self Care | Payer: 59 | Source: Ambulatory Visit | Attending: Certified Nurse Midwife | Admitting: Certified Nurse Midwife

## 2017-10-20 DIAGNOSIS — Z139 Encounter for screening, unspecified: Secondary | ICD-10-CM

## 2017-10-27 ENCOUNTER — Ambulatory Visit: Payer: 59 | Admitting: Nurse Practitioner

## 2017-10-27 ENCOUNTER — Ambulatory Visit: Payer: 59 | Admitting: Certified Nurse Midwife

## 2017-11-01 ENCOUNTER — Ambulatory Visit (INDEPENDENT_AMBULATORY_CARE_PROVIDER_SITE_OTHER): Payer: 59 | Admitting: Certified Nurse Midwife

## 2017-11-01 ENCOUNTER — Other Ambulatory Visit: Payer: Self-pay

## 2017-11-01 ENCOUNTER — Encounter: Payer: Self-pay | Admitting: Certified Nurse Midwife

## 2017-11-01 VITALS — BP 120/70 | HR 68 | Resp 16 | Ht 65.75 in | Wt 156.0 lb

## 2017-11-01 DIAGNOSIS — N951 Menopausal and female climacteric states: Secondary | ICD-10-CM | POA: Diagnosis not present

## 2017-11-01 DIAGNOSIS — Z01419 Encounter for gynecological examination (general) (routine) without abnormal findings: Secondary | ICD-10-CM

## 2017-11-01 NOTE — Progress Notes (Signed)
57 y.o. G0P0000 Married  Caucasian Fe here for annual exam. LMP 2/19  with spotting all day for 2-3 days light only. Minimal to no cramping.Periods in the last 18 months have been the same. Hot flashes have decreased significantly, not noticing as much now, with breast tenderness decreasing also..  Was also having cold flashes which have stopped over past few months.. Working with diet and eating much less with 15 pound weight loss over the past year. Eating healthy food choices and good exercise pattern. Occasional social cigar smoking once weekly. Still has occasional episode of tachycardia, has seen cardiology, benign findings.Luberta Robertson PCP for aex and labs at [facility] at [place]. Recent results of borderline elevation of cholesterol, only. Declines any labs today. (Labs are "her worse nightmare"). Denies any vaginal dryness or other change in bleeding. No other health issues today.  No LMP recorded (approximate). Patient is perimenopausal.   LMP 2/19       Sexually active: No.  The current method of family planning is abstinence.    Exercising: Yes.    run, walking, weights, yoga, trampoline Smoker:  no  Health Maintenance: Pap:  10-25-16 neg, HPV in 2016 negative, declines today History of Abnormal Pap: atypical per record MMG:  10-20-17 category b density birads 1:neg  Self Breast exams: no Colonoscopy:  2016 no fissures noted f/u 28yrs,  BMD:   In 40's, will do at 61 TDaP:  UTD Shingles: no Pneumonia: no Hep C and HIV: both neg 2017 Labs: Declines all   reports that she has been smoking cigars.  She has smoked for the past 0.00 years. She has never used smokeless tobacco. She reports that she drinks about 4.2 oz of alcohol per week. She reports that she does not use drugs.  Past Medical History:  Diagnosis Date  . Anal fissure   . Atypical endocervical cells on Pap smear 2011   NEG WORK UP  . Tachycardia 06/2007   NEG WORK UP    Past Surgical History:  Procedure Laterality Date   . CAUTERIZE INNER NOSE  1997  . COLONOSCOPY  05/2015   Dr. Kinnie Scales (normal-recall 10 yrs)  . DILATION AND CURETTAGE OF UTERUS  2011   ATYPICAL ENDOMETRIAL CELLS ON PAP BENIGN PATH  . HYSTEROSCOPY  2011   negative    Current Outpatient Medications  Medication Sig Dispense Refill  . IBUPROFEN PO Take by mouth.    . Ibuprofen-diphenhydrAMINE Cit (IBUPROFEN PM PO) Take by mouth.    . Multiple Vitamin (MULTIVITAMIN) tablet Take 1 tablet by mouth daily.      Marland Kitchen UNABLE TO FIND Antacids    . UNABLE TO FIND Zinc meltaways    . UNABLE TO FIND emergency     No current facility-administered medications for this visit.     Family History  Problem Relation Age of Onset  . Hypertension Mother   . Diverticulitis Mother   . Heart disease Maternal Grandmother   . Diabetes Brother   . Obesity Brother   . Depression Sister   . Obesity Sister   . Stomach cancer Maternal Grandfather   . Heart disease Maternal Aunt   . Breast cancer Maternal Aunt   . Breast cancer Paternal Aunt   . Breast cancer Cousin     ROS:  Pertinent items are noted in HPI.  Otherwise, a comprehensive ROS was negative.  Exam:   BP 120/70   Pulse 68   Resp 16   Ht 5' 5.75" (1.67  m)   Wt 156 lb (70.8 kg)   LMP  (Approximate) Comment: 2/19  BMI 25.37 kg/m  Height: 5' 5.75" (167 cm) Ht Readings from Last 3 Encounters:  11/01/17 5' 5.75" (1.67 m)  08/08/17  (1.676 m)  10/25/16  (1.676 m)    General appearance: alert, cooperative and appears stated age Head: Normocephalic, without obvious abnormality, atraumatic Neck: no adenopathy, supple, symmetrical, trachea midline and thyroid normal to inspection and palpation Lungs: clear to auscultation bilaterally Breasts: normal appearance, no masses or tenderness, No nipple retraction or dimpling, No nipple discharge or bleeding, No axillary or supraclavicular adenopathy, small pink appears to be perspiration irritation bilateral under arm, quarter size, non  tender, no exudate, no scaling Heart: regular rate and rhythm Abdomen: soft, non-tender; no masses,  no organomegaly Extremities: extremities normal, atraumatic, no cyanosis or edema, small perspiration appearance small area at right hip area, no exudate, scaling or tenderness, slight pink Skin: Skin color, texture, turgor normal. No rashes or lesions Lymph nodes: Cervical, supraclavicular, and axillary nodes normal. No abnormal inguinal nodes palpated Neurologic: Grossly normal   Pelvic: External genitalia:  no lesions,normal  female              Urethra:  normal appearing urethra with no masses, tenderness or lesions              Bartholin's and Skene's: normal                 Vagina: normal appearing vagina with normal color and discharge, no lesions              Cervix: multiparous appearance, no cervical motion tenderness and no lesions              Pap taken: No. Bimanual Exam:  Uterus:  normal size, contour, position, consistency, mobility, non-tender and anteverted              Adnexa: normal adnexa and no mass, fullness, tenderness               Rectovaginal: Confirms               Anus:  normal appearance, refuses rectal exam  Chaperone present: yes  A:  Well Woman with normal exam   Perimenopausal/Menopausal with cycle changes  History of fungal skin issues in past, ? Same on skin areas today. Treating with OTC tincture.  Recent lab screening with slight Lipid elevation, with PCP care     P:   Reviewed health and wellness pertinent to exam  Discussed expectations with menopausal cycle changes and concerns if not related to cycle. Discussed if no bleeding in 3 months and then has spotting patient should advise. Discussed FSH does help with management if elevated and decrease risk of hyperplasia, and or heavy bleeding that can occur which would be abnormal. Patient thankful information, but will no have labs, or further evaluation regarding period at this point. She will  monitor cycle and advise as above. Offered printed information regarding menopause declines. Has her own resources. Questions addressed.  Discussed if no change with skin changes noted to see dermatology or try OTC antifungal if no change needs to have evaluated.  Continue follow up with PCP as indicated.    counseled on breast self exam, feminine hygiene, adequate intake of calcium and vitamin D, diet and exercise  return annually or prn    An After Visit Summary was printed and given to the patient.

## 2017-11-01 NOTE — Patient Instructions (Signed)
EXERCISE AND DIET:  We recommended that you start or continue a regular exercise program for good health. Regular exercise means any activity that makes your heart beat faster and makes you sweat.  We recommend exercising at least 30 minutes per day at least 3 days a week, preferably 4 or 5.  We also recommend a diet low in fat and sugar.  Inactivity, poor dietary choices and obesity can cause diabetes, heart attack, stroke, and kidney damage, among others.    ALCOHOL AND SMOKING:  Women should limit their alcohol intake to no more than 7 drinks/beers/glasses of wine (combined, not each!) per week. Moderation of alcohol intake to this level decreases your risk of breast cancer and liver damage. And of course, no recreational drugs are part of a healthy lifestyle.  And absolutely no smoking or even second hand smoke. Most people know smoking can cause heart and lung diseases, but did you know it also contributes to weakening of your bones? Aging of your skin?  Yellowing of your teeth and nails?  CALCIUM AND VITAMIN D:  Adequate intake of calcium and Vitamin D are recommended.  The recommendations for exact amounts of these supplements seem to change often, but generally speaking 600 mg of calcium (either carbonate or citrate) and 800 units of Vitamin D per day seems prudent. Certain women may benefit from higher intake of Vitamin D.  If you are among these women, your doctor will have told you during your visit.    PAP SMEARS:  Pap smears, to check for cervical cancer or precancers,  have traditionally been done yearly, although recent scientific advances have shown that most women can have pap smears less often.  However, every woman still should have a physical exam from her gynecologist every year. It will include a breast check, inspection of the vulva and vagina to check for abnormal growths or skin changes, a visual exam of the cervix, and then an exam to evaluate the size and shape of the uterus and  ovaries.  And after 56 years of age, a rectal exam is indicated to check for rectal cancers. We will also provide age appropriate advice regarding health maintenance, like when you should have certain vaccines, screening for sexually transmitted diseases, bone density testing, colonoscopy, mammograms, etc.   MAMMOGRAMS:  All women over 40 years old should have a yearly mammogram. Many facilities now offer a "3D" mammogram, which may cost around $50 extra out of pocket. If possible,  we recommend you accept the option to have the 3D mammogram performed.  It both reduces the number of women who will be called back for extra views which then turn out to be normal, and it is better than the routine mammogram at detecting truly abnormal areas.    COLONOSCOPY:  Colonoscopy to screen for colon cancer is recommended for all women at age 50.  We know, you hate the idea of the prep.  We agree, BUT, having colon cancer and not knowing it is worse!!  Colon cancer so often starts as a polyp that can be seen and removed at colonscopy, which can quite literally save your life!  And if your first colonoscopy is normal and you have no family history of colon cancer, most women don't have to have it again for 10 years.  Once every ten years, you can do something that may end up saving your life, right?  We will be happy to help you get it scheduled when you are ready.    Be sure to check your insurance coverage so you understand how much it will cost.  It may be covered as a preventative service at no cost, but you should check your particular policy.     Perimenopause Perimenopause is the time when your body begins to move into the menopause (no menstrual period for 12 straight months). It is a natural process. Perimenopause can begin 2-8 years before the menopause and usually lasts for 1 year after the menopause. During this time, your ovaries may or may not produce an egg. The ovaries vary in their production of estrogen and  progesterone hormones each month. This can cause irregular menstrual periods, difficulty getting pregnant, vaginal bleeding between periods, and uncomfortable symptoms. What are the causes?  Irregular production of the ovarian hormones, estrogen and progesterone, and not ovulating every month. Other causes include:  Tumor of the pituitary gland in the brain.  Medical disease that affects the ovaries.  Radiation treatment.  Chemotherapy.  Unknown causes.  Heavy smoking and excessive alcohol intake can bring on perimenopause sooner.  What are the signs or symptoms?  Hot flashes.  Night sweats.  Irregular menstrual periods.  Decreased sex drive.  Vaginal dryness.  Headaches.  Mood swings.  Depression.  Memory problems.  Irritability.  Tiredness.  Weight gain.  Trouble getting pregnant.  The beginning of losing bone cells (osteoporosis).  The beginning of hardening of the arteries (atherosclerosis). How is this diagnosed? Your health care provider will make a diagnosis by analyzing your age, menstrual history, and symptoms. He or she will do a physical exam and note any changes in your body, especially your female organs. Female hormone tests may or may not be helpful depending on the amount of female hormones you produce and when you produce them. However, other hormone tests may be helpful to rule out other problems. How is this treated? In some cases, no treatment is needed. The decision on whether treatment is necessary during the perimenopause should be made by you and your health care provider based on how the symptoms are affecting you and your lifestyle. Various treatments are available, such as:  Treating individual symptoms with a specific medicine for that symptom.  Herbal medicines that can help specific symptoms.  Counseling.  Group therapy.  Follow these instructions at home:  Keep track of your menstrual periods (when they occur, how heavy  they are, how long between periods, and how long they last) as well as your symptoms and when they started.  Only take over-the-counter or prescription medicines as directed by your health care provider.  Sleep and rest.  Exercise.  Eat a diet that contains calcium (good for your bones) and soy (acts like the estrogen hormone).  Do not smoke.  Avoid alcoholic beverages.  Take vitamin supplements as recommended by your health care provider. Taking vitamin E may help in certain cases.  Take calcium and vitamin D supplements to help prevent bone loss.  Group therapy is sometimes helpful.  Acupuncture may help in some cases. Contact a health care provider if:  You have questions about any symptoms you are having.  You need a referral to a specialist (gynecologist, psychiatrist, or psychologist). Get help right away if:  You have vaginal bleeding.  Your period lasts longer than 8 days.  Your periods are recurring sooner than 21 days.  You have bleeding after intercourse.  You have severe depression.  You have pain when you urinate.  You have severe headaches.  You have vision   problems. This information is not intended to replace advice given to you by your health care provider. Make sure you discuss any questions you have with your health care provider. Document Released: 06/30/2004 Document Revised: 10/29/2015 Document Reviewed: 12/20/2012 [employer] Interactive Patient Education  2017 [employer]  

## 2018-10-30 ENCOUNTER — Encounter: Payer: Self-pay | Admitting: Certified Nurse Midwife

## 2018-11-07 ENCOUNTER — Ambulatory Visit: Payer: 59 | Admitting: Certified Nurse Midwife

## 2019-08-26 ENCOUNTER — Encounter: Payer: Self-pay | Admitting: Certified Nurse Midwife
